# Patient Record
Sex: Male | Born: 1960 | Race: White | Hispanic: No | Marital: Married | Smoking: Former smoker
Health system: Southern US, Community
[De-identification: ages and names within clinical notes are randomized; demographics above are authoritative.]

## PROBLEM LIST (undated history)

## (undated) DIAGNOSIS — F1490 Cocaine use, unspecified, uncomplicated: Secondary | ICD-10-CM

## (undated) DIAGNOSIS — S43006A Unspecified dislocation of unspecified shoulder joint, initial encounter: Secondary | ICD-10-CM

## (undated) DIAGNOSIS — K219 Gastro-esophageal reflux disease without esophagitis: Secondary | ICD-10-CM

## (undated) DIAGNOSIS — T8859XA Other complications of anesthesia, initial encounter: Secondary | ICD-10-CM

## (undated) DIAGNOSIS — R112 Nausea with vomiting, unspecified: Secondary | ICD-10-CM

## (undated) DIAGNOSIS — B059 Measles without complication: Secondary | ICD-10-CM

## (undated) DIAGNOSIS — Z87442 Personal history of urinary calculi: Secondary | ICD-10-CM

## (undated) DIAGNOSIS — H269 Unspecified cataract: Secondary | ICD-10-CM

## (undated) DIAGNOSIS — Z9889 Other specified postprocedural states: Secondary | ICD-10-CM

## (undated) DIAGNOSIS — S32009A Unspecified fracture of unspecified lumbar vertebra, initial encounter for closed fracture: Secondary | ICD-10-CM

## (undated) DIAGNOSIS — F1910 Other psychoactive substance abuse, uncomplicated: Secondary | ICD-10-CM

## (undated) DIAGNOSIS — B019 Varicella without complication: Secondary | ICD-10-CM

## (undated) DIAGNOSIS — T4145XA Adverse effect of unspecified anesthetic, initial encounter: Secondary | ICD-10-CM

## (undated) HISTORY — DX: Unspecified fracture of unspecified lumbar vertebra, initial encounter for closed fracture: S32.009A

## (undated) HISTORY — DX: Unspecified dislocation of unspecified shoulder joint, initial encounter: S43.006A

## (undated) HISTORY — DX: Measles without complication: B05.9

## (undated) HISTORY — DX: Varicella without complication: B01.9

## (undated) HISTORY — DX: Cocaine use, unspecified, uncomplicated: F14.90

## (undated) HISTORY — PX: FRACTURE SURGERY: SHX138

## (undated) HISTORY — DX: Gastro-esophageal reflux disease without esophagitis: K21.9

## (undated) HISTORY — DX: Unspecified cataract: H26.9

## (undated) HISTORY — DX: Other psychoactive substance abuse, uncomplicated: F19.10

## (undated) HISTORY — PX: HERNIA REPAIR: SHX51

---

## 1982-10-19 DIAGNOSIS — S32009A Unspecified fracture of unspecified lumbar vertebra, initial encounter for closed fracture: Secondary | ICD-10-CM

## 1982-10-19 HISTORY — DX: Unspecified fracture of unspecified lumbar vertebra, initial encounter for closed fracture: S32.009A

## 1990-10-19 HISTORY — PX: ORIF ANKLE FRACTURE: SUR919

## 2003-11-09 ENCOUNTER — Encounter: Payer: Self-pay | Admitting: Family Medicine

## 2004-12-01 ENCOUNTER — Ambulatory Visit: Payer: Self-pay | Admitting: Family Medicine

## 2004-12-01 LAB — CONVERTED CEMR LAB
Blood Glucose, Fasting: 90 mg/dL
TSH: 1.09 microintl units/mL

## 2004-12-03 ENCOUNTER — Ambulatory Visit: Payer: Self-pay | Admitting: Family Medicine

## 2005-11-10 ENCOUNTER — Ambulatory Visit: Payer: Self-pay | Admitting: Unknown Physician Specialty

## 2005-11-19 ENCOUNTER — Ambulatory Visit: Payer: Self-pay | Admitting: Unknown Physician Specialty

## 2007-11-11 ENCOUNTER — Encounter: Payer: Self-pay | Admitting: Family Medicine

## 2007-11-11 DIAGNOSIS — E7800 Pure hypercholesterolemia, unspecified: Secondary | ICD-10-CM

## 2007-11-22 ENCOUNTER — Ambulatory Visit: Payer: Self-pay | Admitting: Family Medicine

## 2007-11-22 LAB — CONVERTED CEMR LAB
Bilirubin, Direct: 0.1 mg/dL (ref 0.0–0.3)
CO2: 26 meq/L (ref 19–32)
Creatinine, Ser: 1 mg/dL (ref 0.4–1.5)
HDL: 32.4 mg/dL — ABNORMAL LOW (ref >39.0)
Potassium: 4.2 meq/L (ref 3.5–5.1)
Sodium: 140 meq/L (ref 135–145)
TSH: 1.71 microintl units/mL (ref 0.35–5.50)
Total Bilirubin: 0.9 mg/dL (ref 0.3–1.2)
Total Protein: 6.3 g/dL (ref 6.0–8.3)
Triglycerides: 97 mg/dL (ref 0–149)

## 2007-11-24 ENCOUNTER — Ambulatory Visit: Payer: Self-pay | Admitting: Family Medicine

## 2008-05-11 ENCOUNTER — Emergency Department: Payer: Self-pay | Admitting: Emergency Medicine

## 2010-05-22 ENCOUNTER — Encounter (INDEPENDENT_AMBULATORY_CARE_PROVIDER_SITE_OTHER): Payer: Self-pay | Admitting: *Deleted

## 2010-11-18 NOTE — Letter (Signed)
Summary: Nadara Eaton letter  [place] at [facility]  [address] [place], [place] [address]   Phone: [phone]  Fax: [phone]       05/22/2010 MRN: [id number]  JESTIN BURBACH [address] [place], [place]  [address]  Dear Mr. Jennye [facility] [place], and [place] announce the retirement of Arta Silence, M.D., from full-time practice at the [facility] office effective April 17, 2010 and his plans of returning part-time.  It is important to Dr. Hetty Ely and to our practice that you understand that [facility] has seven physicians in our office for your health care needs.  We will continue to offer the same exceptional care that you have today.    Dr. Hetty Ely has spoken to many of you about his plans for retirement and returning part-time in the fall.   We will continue to work with you through the transition to schedule appointments for you in the office and meet the high standards that [place] is committed to.   Again, it is with great pleasure that we share the news that Dr. Hetty Ely will return to [facility] at [facility] in October of 2011 with a reduced schedule.    If you have any questions, or would like to request an appointment with one of our physicians, please call us at [phone] and press the option for Scheduling an appointment.  We take pleasure in providing you with excellent patient care and look forward to seeing you at your next office visit.  Our [facility] Physicians are:  Tillman Abide, M.D. Laurita Quint, M.D. Roxy Manns, M.D. Kerby Nora, M.D. Hannah Beat, M.D. Ruthe Mannan, M.D. We proudly welcomed Raechel Ache, M.D. and Eustaquio Boyden, M.D. to the practice in July/August 2011.  Sincerely,  [facility]

## 2011-04-18 ENCOUNTER — Encounter: Payer: Self-pay | Admitting: Family Medicine

## 2011-04-30 ENCOUNTER — Ambulatory Visit: Payer: Self-pay | Admitting: Family Medicine

## 2011-12-11 ENCOUNTER — Ambulatory Visit: Payer: Self-pay | Admitting: Internal Medicine

## 2012-01-08 ENCOUNTER — Ambulatory Visit (INDEPENDENT_AMBULATORY_CARE_PROVIDER_SITE_OTHER): Payer: BC Managed Care – PPO | Admitting: Internal Medicine

## 2012-01-08 ENCOUNTER — Encounter: Payer: Self-pay | Admitting: Internal Medicine

## 2012-01-08 DIAGNOSIS — Z0000 Encounter for general adult medical examination without abnormal findings: Secondary | ICD-10-CM | POA: Insufficient documentation

## 2012-01-08 DIAGNOSIS — F1911 Other psychoactive substance abuse, in remission: Secondary | ICD-10-CM | POA: Insufficient documentation

## 2012-01-08 DIAGNOSIS — E7800 Pure hypercholesterolemia, unspecified: Secondary | ICD-10-CM

## 2012-01-08 DIAGNOSIS — Z23 Encounter for immunization: Secondary | ICD-10-CM

## 2012-01-08 LAB — LIPID PANEL
LDL Cholesterol: 90 mg/dL (ref 0–99)
Total CHOL/HDL Ratio: 4.5 Ratio

## 2012-01-08 LAB — COMPREHENSIVE METABOLIC PANEL
ALT: 17 U/L (ref 0–53)
AST: 18 U/L (ref 0–37)
Calcium: 8.6 mg/dL (ref 8.4–10.5)
Chloride: 108 mEq/L (ref 96–112)
Creat: 1.12 mg/dL (ref 0.50–1.35)
Potassium: 4.1 mEq/L (ref 3.5–5.3)

## 2012-01-08 LAB — CBC WITH DIFFERENTIAL/PLATELET
Basophils Absolute: 0 10*3/uL (ref 0.0–0.1)
Eosinophils Relative: 1 % (ref 0–5)
Lymphocytes Relative: 32 % (ref 12–46)
Neutro Abs: 3.6 10*3/uL (ref 1.7–7.7)
Platelets: 226 10*3/uL (ref 150–400)
RDW: 12.1 % (ref 11.5–15.5)
WBC: 6 10*3/uL (ref 4.0–10.5)

## 2012-01-08 NOTE — Assessment & Plan Note (Signed)
Patient history of cocaine abuse. Currently drug-free. Encouraged him to continue to follow with his sponsor. Encourage continued abstinence. Followup as needed.

## 2012-01-08 NOTE — Progress Notes (Signed)
  Subjective:    Patient ID: Alexander Walter, male    DOB: [birth year], 51 y.o.   MRN: [id number]  HPI 51 year old male presents to establish care and for general exam. He reports that he is generally doing well. 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 He reports that he has generally been healthy in his life. He has suffered from addiction to cocaine. He notes that he has been drug free for the last 8 months. At one point, he was drug free for 10 years. He notes this has been a difficult struggle for him. He is currently separated from his wife but living with her. He denies any complaints today. He reports he is feeling well. He has a normal appetite and energy level.  No outpatient encounter prescriptions on file as of 01/08/2012.    Review of Systems  Constitutional: Negative for fever, chills, activity change, appetite change, fatigue and unexpected weight change.  Eyes: Negative for visual disturbance.  Respiratory: Negative for cough and shortness of breath.   Cardiovascular: Negative for chest pain, palpitations and leg swelling.  Gastrointestinal: Negative for abdominal pain and abdominal distention.  Genitourinary: Negative for dysuria, urgency and difficulty urinating.  Musculoskeletal: Negative for arthralgias and gait problem.  Skin: Negative for color change and rash.  Hematological: Negative for adenopathy.  Psychiatric/Behavioral: Negative for sleep disturbance and dysphoric mood. The patient is not nervous/anxious.    BP 108/62  Pulse 81  Temp(Src) 98 F (36.7 C) (Oral)  Ht 5\' 7"  (1.702 m)  Wt 180 lb (81.647 kg)  BMI 28.19 kg/m2  SpO2 97%     Objective:   Physical Exam  Constitutional: He is oriented to person, place, and time. He appears well-developed and well-nourished. No distress.  HENT:  Head: Normocephalic and atraumatic.  Right Ear: External ear normal.  Left Ear: External ear normal.  Nose: Nose normal.    Mouth/Throat: Oropharynx is clear and moist. No oropharyngeal exudate.  Eyes: Conjunctivae and EOM are normal. Pupils are equal, round, and reactive to light. Right eye exhibits no discharge. Left eye exhibits no discharge. No scleral icterus.  Neck: Normal range of motion. Neck supple. No tracheal deviation present. No thyromegaly present.  Cardiovascular: Normal rate, regular rhythm and normal heart sounds.  Exam reveals no gallop and no friction rub.   No murmur heard. Pulmonary/Chest: Effort normal and breath sounds normal. No respiratory distress. He has no wheezes. He has no rales. He exhibits no tenderness.  Abdominal: Soft. Bowel sounds are normal. He exhibits no distension and no mass. There is no tenderness. There is no rebound and no guarding.  Musculoskeletal: Normal range of motion. He exhibits no edema.  Lymphadenopathy:    He has no cervical adenopathy.  Neurological: He is alert and oriented to person, place, and time. No cranial nerve deficit. Coordination normal.  Skin: Skin is warm and dry. No rash noted. He is not diaphoretic. No erythema. No pallor.  Psychiatric: He has a normal mood and affect. His behavior is normal. Judgment and thought content normal.          Assessment & Plan:

## 2012-01-08 NOTE — Assessment & Plan Note (Signed)
Exam normal today. Will check CBC, CMP, and lipids.  Will give TDAP.  Discussed screening colonoscopy and pt will look into insurance coverage for this.  Follow up 1 year and prn.

## 2012-10-19 HISTORY — PX: COLONOSCOPY: SHX174

## 2012-12-03 ENCOUNTER — Other Ambulatory Visit: Payer: Self-pay

## 2013-02-24 ENCOUNTER — Encounter: Payer: Self-pay | Admitting: Internal Medicine

## 2013-02-24 ENCOUNTER — Ambulatory Visit (INDEPENDENT_AMBULATORY_CARE_PROVIDER_SITE_OTHER): Payer: BC Managed Care – PPO | Admitting: Internal Medicine

## 2013-02-24 VITALS — BP 98/68 | HR 80 | Temp 97.9°F | Ht 67.0 in | Wt 179.0 lb

## 2013-02-24 DIAGNOSIS — Z0000 Encounter for general adult medical examination without abnormal findings: Secondary | ICD-10-CM

## 2013-02-24 LAB — COMPREHENSIVE METABOLIC PANEL
ALT: 18 U/L (ref 0–53)
AST: 18 U/L (ref 0–37)
Calcium: 9.1 mg/dL (ref 8.4–10.5)
Chloride: 105 mEq/L (ref 96–112)
Creatinine, Ser: 1.2 mg/dL (ref 0.4–1.5)
Potassium: 4.2 mEq/L (ref 3.5–5.1)
Sodium: 138 mEq/L (ref 135–145)
Total Protein: 6.5 g/dL (ref 6.0–8.3)

## 2013-02-24 LAB — CBC WITH DIFFERENTIAL/PLATELET
Eosinophils Absolute: 0.1 10*3/uL (ref 0.0–0.7)
MCHC: 34.8 g/dL (ref 30.0–36.0)
MCV: 94.7 fl (ref 78.0–100.0)
Monocytes Absolute: 0.5 10*3/uL (ref 0.1–1.0)
Neutrophils Relative %: 66 % (ref 43.0–77.0)
Platelets: 213 10*3/uL (ref 150.0–400.0)
WBC: 5.7 10*3/uL (ref 4.5–10.5)

## 2013-02-24 LAB — LIPID PANEL
HDL: 35.9 mg/dL — ABNORMAL LOW (ref >39.00)
LDL Cholesterol: 98 mg/dL (ref 0–99)
VLDL: 29 mg/dL (ref 0.0–40.0)

## 2013-02-24 NOTE — Progress Notes (Signed)
  Subjective:    Patient ID: Alexander Walter, male    DOB: Dec 04, 1960, 52 y.o.   MRN: [id number]  HPI 52YO male presents for annual exam. Doing well. No concerns today. Trying to follow healthy diet. 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 Also has second job at [employer].   No outpatient encounter prescriptions on file as of 02/24/2013.   No facility-administered encounter medications on file as of 02/24/2013.   BP 98/68  Pulse 80  Temp(Src) 97.9 F (36.6 C) (Oral)  Ht 5\' 7"  (1.702 m)  Wt 179 lb (81.194 kg)  BMI 28.03 kg/m2  SpO2 96%  Review of Systems  Constitutional: Negative for fever, chills, activity change, appetite change, fatigue and unexpected weight change.  Eyes: Negative for visual disturbance.  Respiratory: Negative for cough and shortness of breath.   Cardiovascular: Negative for chest pain, palpitations and leg swelling.  Gastrointestinal: Negative for abdominal pain and abdominal distention.  Genitourinary: Negative for dysuria, urgency and difficulty urinating.  Musculoskeletal: Negative for arthralgias and gait problem.  Skin: Negative for color change and rash.  Hematological: Negative for adenopathy.  Psychiatric/Behavioral: Negative for sleep disturbance and dysphoric mood. The patient is not nervous/anxious.        Objective:   Physical Exam  Constitutional: He is oriented to person, place, and time. He appears well-developed and well-nourished. No distress.  HENT:  Head: Normocephalic and atraumatic.  Right Ear: External ear normal.  Left Ear: External ear normal.  Nose: Nose normal.  Mouth/Throat: Oropharynx is clear and moist. No oropharyngeal exudate.  Eyes: Conjunctivae and EOM are normal. Pupils are equal, round, and reactive to light. Right eye exhibits no discharge. Left eye exhibits no discharge. No scleral icterus.  Neck: Normal range of motion. Neck supple. No tracheal deviation present. No thyromegaly present.  Cardiovascular: Normal rate,  regular rhythm and normal heart sounds.  Exam reveals no gallop and no friction rub.   No murmur heard. Pulmonary/Chest: Effort normal and breath sounds normal. No respiratory distress. He has no wheezes. He has no rales. He exhibits no tenderness.  Abdominal: Soft. Bowel sounds are normal. He exhibits no distension and no mass. There is no tenderness. There is no rebound and no guarding.  Musculoskeletal: Normal range of motion. He exhibits no edema.  Lymphadenopathy:    He has no cervical adenopathy.  Neurological: He is alert and oriented to person, place, and time. No cranial nerve deficit. Coordination normal.  Skin: Skin is warm and dry. No rash noted. He is not diaphoretic. No erythema. No pallor.  Psychiatric: He has a normal mood and affect. His behavior is normal. Judgment and thought content normal.          Assessment & Plan:

## 2013-02-24 NOTE — Assessment & Plan Note (Signed)
General medical exam normal today. Health maintenance is up to date except for colonoscopy. Referral placed today. Reviewed risks and benefits of PSA testing. Will check PSA today with labs. Will also check CMP, Lipids, CBC with labs. Encouraged continued healthy diet and regular physical activity. Follow up 1 year and prn.

## 2013-02-25 LAB — PSA, TOTAL AND FREE: PSA, Free Pct: 22 % — ABNORMAL LOW (ref >25)

## 2013-05-18 ENCOUNTER — Encounter: Payer: Self-pay | Admitting: Unknown Physician Specialty

## 2013-08-24 ENCOUNTER — Other Ambulatory Visit: Payer: Self-pay

## 2014-03-02 ENCOUNTER — Encounter: Payer: BC Managed Care – PPO | Admitting: Internal Medicine

## 2015-09-11 ENCOUNTER — Encounter: Payer: Self-pay | Admitting: Family Medicine

## 2015-09-11 ENCOUNTER — Ambulatory Visit (INDEPENDENT_AMBULATORY_CARE_PROVIDER_SITE_OTHER): Payer: BLUE CROSS/BLUE SHIELD | Admitting: Family Medicine

## 2015-09-11 VITALS — BP 125/86 | HR 86 | Temp 97.7°F | Resp 16 | Ht 67.0 in | Wt 177.6 lb

## 2015-09-11 DIAGNOSIS — R0602 Shortness of breath: Secondary | ICD-10-CM

## 2015-09-11 DIAGNOSIS — K219 Gastro-esophageal reflux disease without esophagitis: Secondary | ICD-10-CM | POA: Diagnosis not present

## 2015-09-11 DIAGNOSIS — F17200 Nicotine dependence, unspecified, uncomplicated: Secondary | ICD-10-CM | POA: Insufficient documentation

## 2015-09-11 DIAGNOSIS — Z720 Tobacco use: Secondary | ICD-10-CM

## 2015-09-11 DIAGNOSIS — R0789 Other chest pain: Secondary | ICD-10-CM

## 2015-09-11 DIAGNOSIS — Z8249 Family history of ischemic heart disease and other diseases of the circulatory system: Secondary | ICD-10-CM | POA: Diagnosis not present

## 2015-09-11 HISTORY — DX: Shortness of breath: R06.02

## 2015-09-11 MED ORDER — OMEPRAZOLE 20 MG PO CPDR: 20.0000 mg | DELAYED_RELEASE_CAPSULE | Freq: Every day | ORAL | Status: DC

## 2015-09-11 MED ORDER — ALBUTEROL SULFATE HFA 108 (90 BASE) MCG/ACT IN AERS: 2.0000 | INHALATION_SPRAY | Freq: Four times a day (QID) | RESPIRATORY_TRACT | Status: DC | PRN

## 2015-09-11 NOTE — Patient Instructions (Signed)
I think your symptoms are coming from your lungs but I do want to rule out a cardiac cause given your family history. We will schedule an appt at [facility] to rule out cardiac causes.   Take your acid reflux medication every day 1 hour prior to a meal. This may help improve your symptoms.   Please seek immediate medical attention at ER or Urgent Care if you develop: Chest pain, pressure or tightness. Shortness of breath accompanied by nausea or diaphoresis Visual changes Numbness or tingling on one side of the body Facial droop Altered mental status Or any concerning symptoms.

## 2015-09-11 NOTE — Progress Notes (Signed)
Subjective:    Patient ID: Alexander Walter, male    DOB: [birth year], 54 y.o.   MRN: [id number]  HPI: Alexander Walter is a 54 y.o. male presenting on 09/11/2015 for Establish Care   HPI  Pt presents to establish care today. Previous care provider was [employer] [facility].  It has been several years since last PCP visit. Records from previous provider will be requested and reviewed. Current medical problems include:  Acid Reflux: Occasional symptoms. Has been occuring more as he ages. Occasional tums over the counter. Triggers: chicken.  Feels as though food occasionally gets stuck.  SOB/chest tightness: Pt reporting SOB started 1 month ago. SOB occurred at rest. Described as needed to take deep breath. Chest tightness feels as though if he stretched he could get rid of it. No palpitations. Strong family history of heart disease- onset in the 67's. No pattern to symptoms. Not impacted by exertion.   Health maintenance:  Colonoscopy: 2014 normal. PSA: unsure if he would like to screen.  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     Past Medical History  Diagnosis Date  . Crack cocaine use     Last use approx 8 mths ago(12/2011)  . Lumbar vertebral fracture ([place]) 1984  . Dislocated shoulder   . Kidney stone 2009  . Chicken pox     childhood.  . Measles     childhood.   Marland Kitchen GERD (gastroesophageal reflux disease)    Social History   Social History  . Marital Status: Married    Spouse Name: N/A  . Number of Children: 2  . Years of Education: N/A   Occupational History  . [employer]ie Better Surveyor    Social History Main Topics  . Smoking status: Current Some Day Smoker -- 0.50 packs/day for 1 years    Types: Cigarettes    Last Attempt to Quit: 01/08/2011  . Smokeless tobacco: Current User  . Alcohol Use: No     Comment: Last drank 3 years ago  . Drug Use: No  . Sexual Activity: Not on file   Other Topics Concern  . Not on file   Social History  Narrative   Lives in [place], separated and lives with wife. 15YO daughter and 30YO son. 3 dogs and fish. Works as [employer]. Goes to [employer].   Family History  Problem Relation Age of Onset  . Hyperlipidemia Mother   . Heart disease Mother     murmur  . Depression Father   . Diabetes Father   . Heart disease Maternal Uncle     MI's multiple  . Cancer Maternal Grandfather     ? MGF  . Heart disease Maternal Uncle 50   No current outpatient prescriptions on file prior to visit.   No current facility-administered medications on file prior to visit.    Review of Systems  Constitutional: Negative for fever and chills.  HENT: Negative.   Respiratory: Positive for chest tightness and shortness of breath. Negative for wheezing.   Cardiovascular: Positive for chest pain (described as tightness or pressure.). Negative for palpitations and leg swelling.  Gastrointestinal: Negative for nausea, vomiting and abdominal pain.  Endocrine: Negative.   Genitourinary: Negative for dysuria, urgency, discharge, penile pain and testicular pain.  Musculoskeletal: Negative for back pain, joint swelling and arthralgias.  Skin: Negative.   Neurological: Negative for dizziness, weakness, numbness and headaches.  Psychiatric/Behavioral: Negative for sleep disturbance and dysphoric mood.   Per HPI unless specifically  indicated above     Objective:    BP 125/86 mmHg  Pulse 86  Temp(Src) 97.7 F (36.5 C) (Oral)  Resp 16  Ht 5' 7"  (1.702 m)  Wt 177 lb 9.6 oz (80.559 kg)  BMI 27.81 kg/m2  Wt Readings from Last 3 Encounters:  09/11/15 177 lb 9.6 oz (80.559 kg)  02/24/13 179 lb (81.194 kg)  01/08/12 180 lb (81.647 kg)    Physical Exam  Constitutional: He is oriented to person, place, and time. He appears well-developed and well-nourished. No distress.  HENT:  Head: Normocephalic and atraumatic.  Neck: Neck supple. No thyromegaly present.  Cardiovascular: Normal rate, regular  rhythm and normal heart sounds.  Exam reveals no gallop and no friction rub.   No murmur heard. Pulmonary/Chest: Effort normal and breath sounds normal. He has no wheezes.  Abdominal: Soft. Bowel sounds are normal. He exhibits no distension. There is no tenderness. There is no rebound.  Musculoskeletal: Normal range of motion. He exhibits no edema or tenderness.  Neurological: He is alert and oriented to person, place, and time. He has normal reflexes.  Skin: Skin is warm and dry. No rash noted. No erythema.  Psychiatric: He has a normal mood and affect. His behavior is normal. Thought content normal.   Results for orders placed or performed in visit on 02/24/13  Comp Met (CMET)  Result Value Ref Range   Sodium 138 135 - 145 mEq/L   Potassium 4.2 3.5 - 5.1 mEq/L   Chloride 105 96 - 112 mEq/L   CO2 26 19 - 32 mEq/L   Glucose, Bld 83 70 - 99 mg/dL   BUN 15 6 - 23 mg/dL   Creatinine, Ser 1.2 0.4 - 1.5 mg/dL   Total Bilirubin 0.8 0.3 - 1.2 mg/dL   Alkaline Phosphatase 59 39 - 117 U/L   AST 18 0 - 37 U/L   ALT 18 0 - 53 U/L   Total Protein 6.5 6.0 - 8.3 g/dL   Albumin 3.8 3.5 - 5.2 g/dL   Calcium 9.1 8.4 - 10.5 mg/dL   GFR 68.32 >60.00 mL/min  PSA, total and free  Result Value Ref Range   PSA 1.44 <=4.00 ng/mL   PSA, Free 0.31 ng/mL   PSA, Free Pct 22 (L) >25 %  Lipid Profile  Result Value Ref Range   Cholesterol 163 0 - 200 mg/dL   Triglycerides 145.0 0.0 - 149.0 mg/dL   HDL 35.90 (L) >39.00 mg/dL   VLDL 29.0 0.0 - 40.0 mg/dL   LDL Cholesterol 98 0 - 99 mg/dL   Total CHOL/HDL Ratio 5   CBC w/Diff  Result Value Ref Range   WBC 5.7 4.5 - 10.5 K/uL   RBC 4.48 4.22 - 5.81 Mil/uL   Hemoglobin 14.8 13.0 - 17.0 g/dL   HCT 42.4 39.0 - 52.0 %   MCV 94.7 78.0 - 100.0 fl   MCHC 34.8 30.0 - 36.0 g/dL   RDW 12.8 11.5 - 14.6 %   Platelets 213.0 150.0 - 400.0 K/uL   Neutrophils Relative % 66.0 43.0 - 77.0 %   Lymphocytes Relative 23.9 12.0 - 46.0 %   Monocytes Relative 8.5 3.0 - 12.0  %   Eosinophils Relative 1.0 0.0 - 5.0 %   Basophils Relative 0.6 0.0 - 3.0 %   Neutro Abs 3.8 1.4 - 7.7 K/uL   Lymphs Abs 1.4 0.7 - 4.0 K/uL   Monocytes Absolute 0.5 0.1 - 1.0 K/uL   Eosinophils Absolute 0.1 0.0 -  0.7 K/uL   Basophils Absolute 0.0 0.0 - 0.1 K/uL      Assessment & Plan:   Problem List Items Addressed This Visit      Digestive   GERD (gastroesophageal reflux disease)   Relevant Medications   omeprazole (PRILOSEC) 20 MG capsule   Other Relevant Orders   CBC with Differential     Other   Smoker   Shortness of breath - Primary    GERD inducted bronchospasm vs. Cardiovascular disease. R/o ASCVD due to strong family history of heart disease.   Trial of albulterol inhaler. Spirometry next visit.       Relevant Orders   EKG 12-Lead   Comprehensive Metabolic Panel (CMET)   Chest tightness    GERD induced asthmas vs cardiovascular origin. ECG WNL. Given strong family history- would like Cardiology consult to stratify risk factors. Proceed with respiratory work-up after cardiac origin excluded.       Relevant Orders   EKG 12-Lead   Lipid Profile    Other Visit Diagnoses    Family history of heart disease in male family member before age 31        Relevant Orders    Lipid Profile       Meds ordered this encounter  Medications  . omeprazole (PRILOSEC) 20 MG capsule    Sig: Take 1 capsule (20 mg total) by mouth daily.    Dispense:  30 capsule    Refill:  3    Order Specific Question:  Supervising Provider    Answer:  Arlis Porta [[id number]]      Follow up plan: No Follow-up on file.

## 2015-09-11 NOTE — Assessment & Plan Note (Signed)
GERD inducted bronchospasm vs. Cardiovascular disease. R/o ASCVD due to strong family history of heart disease.   Trial of albulterol inhaler. Spirometry next visit.

## 2015-09-11 NOTE — Assessment & Plan Note (Signed)
5 minutes of smoking cessation counseling provided. Pt encouraged to set a quit date. Referred to [place] Quitline.

## 2015-09-11 NOTE — Assessment & Plan Note (Signed)
GERD induced asthmas vs cardiovascular origin. ECG WNL. Given strong family history- would like Cardiology consult to stratify risk factors. Proceed with respiratory work-up after cardiac origin excluded.

## 2015-09-12 LAB — CBC WITH DIFFERENTIAL/PLATELET
BASOS ABS: 0 10*3/uL (ref 0.0–0.2)
BASOS: 1 %
EOS (ABSOLUTE): 0.1 10*3/uL (ref 0.0–0.4)
Eos: 1 %
HEMOGLOBIN: 15.2 g/dL (ref 12.6–17.7)
Hematocrit: 44.4 % (ref 37.5–51.0)
Immature Grans (Abs): 0 10*3/uL (ref 0.0–0.1)
Immature Granulocytes: 1 %
LYMPHS: 29 %
Lymphocytes Absolute: 1.7 10*3/uL (ref 0.7–3.1)
MCH: 32.3 pg (ref 26.6–33.0)
MCHC: 34.2 g/dL (ref 31.5–35.7)
MCV: 94 fL (ref 79–97)
Monocytes Absolute: 0.5 10*3/uL (ref 0.1–0.9)
Monocytes: 8 %
NEUTROS PCT: 60 %
Neutrophils Absolute: 3.5 10*3/uL (ref 1.4–7.0)
Platelets: 276 10*3/uL (ref 150–379)
RBC: 4.71 x10E6/uL (ref 4.14–5.80)
RDW: 12.3 % (ref 12.3–15.4)
WBC: 5.8 10*3/uL (ref 3.4–10.8)

## 2015-09-12 LAB — COMPREHENSIVE METABOLIC PANEL
ALK PHOS: 65 IU/L (ref 39–117)
ALT: 17 IU/L (ref 0–44)
AST: 15 IU/L (ref 0–40)
Albumin/Globulin Ratio: 1.8 (ref 1.1–2.5)
Albumin: 4.3 g/dL (ref 3.5–5.5)
BUN/Creatinine Ratio: 12 (ref 9–20)
BUN: 12 mg/dL (ref 6–24)
Bilirubin Total: 0.5 mg/dL (ref 0.0–1.2)
CO2: 23 mmol/L (ref 18–29)
CREATININE: 0.99 mg/dL (ref 0.76–1.27)
Calcium: 9.4 mg/dL (ref 8.7–10.2)
Chloride: 100 mmol/L (ref 97–106)
GFR calc Af Amer: 98 mL/min/{1.73_m2} (ref >59)
GFR calc non Af Amer: 85 mL/min/{1.73_m2} (ref >59)
GLUCOSE: 78 mg/dL (ref 65–99)
Globulin, Total: 2.4 g/dL (ref 1.5–4.5)
Potassium: 4.6 mmol/L (ref 3.5–5.2)
SODIUM: 139 mmol/L (ref 136–144)
Total Protein: 6.7 g/dL (ref 6.0–8.5)

## 2015-09-12 LAB — LIPID PANEL
CHOLESTEROL TOTAL: 183 mg/dL (ref 100–199)
Chol/HDL Ratio: 4.5 ratio units (ref 0.0–5.0)
HDL: 41 mg/dL (ref >39)
LDL CALC: 110 mg/dL — AB (ref 0–99)
TRIGLYCERIDES: 161 mg/dL — AB (ref 0–149)
VLDL Cholesterol Cal: 32 mg/dL (ref 5–40)

## 2015-11-20 ENCOUNTER — Ambulatory Visit: Payer: BC Managed Care – PPO | Admitting: Cardiovascular Disease

## 2016-01-28 ENCOUNTER — Encounter: Payer: Self-pay | Admitting: Family Medicine

## 2016-01-28 ENCOUNTER — Ambulatory Visit (INDEPENDENT_AMBULATORY_CARE_PROVIDER_SITE_OTHER): Payer: BLUE CROSS/BLUE SHIELD | Admitting: Family Medicine

## 2016-01-28 VITALS — BP 127/86 | HR 68 | Temp 98.3°F | Resp 16 | Ht 67.0 in | Wt 181.0 lb

## 2016-01-28 DIAGNOSIS — R1900 Intra-abdominal and pelvic swelling, mass and lump, unspecified site: Secondary | ICD-10-CM | POA: Diagnosis not present

## 2016-01-28 DIAGNOSIS — R1909 Other intra-abdominal and pelvic swelling, mass and lump: Secondary | ICD-10-CM

## 2016-01-28 NOTE — Progress Notes (Signed)
Subjective:    Patient ID: Alexander Walter, male    DOB: 01-28-1961, 55 y.o.   MRN: [id number]  HPI: Jazarion Mcmannis is a 55 y.o. male presenting on 01/28/2016 for Groin Swelling   HPI  Pt presents for groin swelling on R side. Noticed a bulge around 1 week ago. Not painful. Has not noticed a lump or bulge present before. Bulge is staying the same size. Does not do heavy lifting. No recent moving or straining. Pt is overweight. No color change in the skin. Soft bulge. No previous history of hernia.   Past Medical History  Diagnosis Date  . Crack cocaine use     Last use approx 8 mths ago(12/2011)  . Lumbar vertebral fracture ([place]) 1984  . Dislocated shoulder   . Kidney stone 2009  . Chicken pox     childhood.  . Measles     childhood.   Marland Kitchen GERD (gastroesophageal reflux disease)     No current outpatient prescriptions on file prior to visit.   No current facility-administered medications on file prior to visit.    Review of Systems Per HPI unless specifically indicated above     Objective:    BP 127/86 mmHg  Pulse 68  Temp(Src) 98.3 F (36.8 C) (Oral)  Resp 16  Ht 5\' 7"  (1.702 m)  Wt 181 lb (82.101 kg)  BMI 28.34 kg/m2  Wt Readings from Last 3 Encounters:  01/28/16 181 lb (82.101 kg)  09/11/15 177 lb 9.6 oz (80.559 kg)  02/24/13 179 lb (81.194 kg)    Physical Exam  Constitutional: He is oriented to person, place, and time. He appears well-developed and well-nourished. No distress.  Cardiovascular: Normal rate and regular rhythm.  Exam reveals no gallop and no friction rub.   No murmur heard. Pulmonary/Chest: Effort normal and breath sounds normal.  Abdominal: Soft. Bowel sounds are normal. There is no hepatosplenomegaly, splenomegaly or hepatomegaly. There is no tenderness. Hernia confirmed negative in the right inguinal area and confirmed negative in the left inguinal area.    Genitourinary: Penis normal. Circumcised.  Neurological: He is alert and  oriented to person, place, and time.  Skin: He is not diaphoretic.   Results for orders placed or performed in visit on 09/11/15  Lipid Profile  Result Value Ref Range   Cholesterol, Total 183 100 - 199 mg/dL   Triglycerides 161 (H) 0 - 149 mg/dL   HDL 41 >39 mg/dL   VLDL Cholesterol Cal 32 5 - 40 mg/dL   LDL Calculated 110 (H) 0 - 99 mg/dL   Chol/HDL Ratio 4.5 0.0 - 5.0 ratio units  Comprehensive Metabolic Panel (CMET)  Result Value Ref Range   Glucose 78 65 - 99 mg/dL   BUN 12 6 - 24 mg/dL   Creatinine, Ser 0.99 0.76 - 1.27 mg/dL   GFR calc non Af Amer 85 >59 mL/min/1.73   GFR calc Af Amer 98 >59 mL/min/1.73   BUN/Creatinine Ratio 12 9 - 20   Sodium 139 136 - 144 mmol/L   Potassium 4.6 3.5 - 5.2 mmol/L   Chloride 100 97 - 106 mmol/L   CO2 23 18 - 29 mmol/L   Calcium 9.4 8.7 - 10.2 mg/dL   Total Protein 6.7 6.0 - 8.5 g/dL   Albumin 4.3 3.5 - 5.5 g/dL   Globulin, Total 2.4 1.5 - 4.5 g/dL   Albumin/Globulin Ratio 1.8 1.1 - 2.5   Bilirubin Total 0.5 0.0 - 1.2 mg/dL   Alkaline Phosphatase  65 39 - 117 IU/L   AST 15 0 - 40 IU/L   ALT 17 0 - 44 IU/L  CBC with Differential  Result Value Ref Range   WBC 5.8 3.4 - 10.8 x10E3/uL   RBC 4.71 4.14 - 5.80 x10E6/uL   Hemoglobin 15.2 12.6 - 17.7 g/dL   Hematocrit 44.4 37.5 - 51.0 %   MCV 94 79 - 97 fL   MCH 32.3 26.6 - 33.0 pg   MCHC 34.2 31.5 - 35.7 g/dL   RDW 12.3 12.3 - 15.4 %   Platelets 276 150 - 379 x10E3/uL   Neutrophils 60 %   Lymphs 29 %   Monocytes 8 %   Eos 1 %   Basos 1 %   Neutrophils Absolute 3.5 1.4 - 7.0 x10E3/uL   Lymphocytes Absolute 1.7 0.7 - 3.1 x10E3/uL   Monocytes Absolute 0.5 0.1 - 0.9 x10E3/uL   EOS (ABSOLUTE) 0.1 0.0 - 0.4 x10E3/uL   Basophils Absolute 0.0 0.0 - 0.2 x10E3/uL   Immature Granulocytes 1 %   Immature Grans (Abs) 0.0 0.0 - 0.1 x10E3/uL      Assessment & Plan:   Problem List Items Addressed This Visit    None    Visit Diagnoses    Groin swelling    -  Primary    [place] to r/o hernia.  Consider surgical referral if positive for hernia. Alarm symptoms reviewed with patient. Follow-up pending [place].     Relevant Orders    [place] Extrem Low Right Ltd       No orders of the defined types were placed in this encounter.      Follow up plan: Return if symptoms worsen or fail to improve, for pending [place]. Marland Kitchen

## 2016-01-28 NOTE — Patient Instructions (Signed)
We will rule out a hernia with an ultrasound. If the bulge gets larger, turns red, you have severe abdominal pain, nausea, or vomiting- please go to the ER.   We will contact you about the ultrasound.

## 2016-02-04 ENCOUNTER — Ambulatory Visit
Admission: RE | Admit: 2016-02-04 | Discharge: 2016-02-04 | Disposition: A | Discharge status: Home / Self Care | Payer: BLUE CROSS/BLUE SHIELD | Source: Ambulatory Visit | Attending: Family Medicine | Admitting: Family Medicine

## 2016-02-04 ENCOUNTER — Other Ambulatory Visit: Payer: Self-pay | Admitting: Family Medicine

## 2016-02-04 DIAGNOSIS — K4090 Unilateral inguinal hernia, without obstruction or gangrene, not specified as recurrent: Secondary | ICD-10-CM

## 2016-02-04 DIAGNOSIS — R1900 Intra-abdominal and pelvic swelling, mass and lump, unspecified site: Secondary | ICD-10-CM | POA: Insufficient documentation

## 2016-02-05 ENCOUNTER — Encounter: Payer: Self-pay | Admitting: *Deleted

## 2016-02-19 ENCOUNTER — Ambulatory Visit (INDEPENDENT_AMBULATORY_CARE_PROVIDER_SITE_OTHER): Payer: BLUE CROSS/BLUE SHIELD | Admitting: General Surgery

## 2016-02-19 ENCOUNTER — Encounter: Payer: Self-pay | Admitting: General Surgery

## 2016-02-19 VITALS — BP 120/74 | HR 78 | Resp 14 | Ht 67.0 in | Wt 182.0 lb

## 2016-02-19 DIAGNOSIS — K4090 Unilateral inguinal hernia, without obstruction or gangrene, not specified as recurrent: Secondary | ICD-10-CM | POA: Diagnosis not present
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
## 2016-02-19 NOTE — Patient Instructions (Addendum)

## 2016-03-03 ENCOUNTER — Encounter: Payer: Self-pay | Admitting: General Surgery

## 2016-03-30 ENCOUNTER — Inpatient Hospital Stay: Admission: RE | Admit: 2016-03-30 | Payer: BC Managed Care – PPO | Source: Ambulatory Visit

## 2016-03-30 ENCOUNTER — Telehealth: Payer: Self-pay | Admitting: *Deleted

## 2016-03-30 ENCOUNTER — Encounter: Payer: Self-pay | Admitting: *Deleted

## 2016-03-30 NOTE — Telephone Encounter (Signed)
Per Judeen Hammans with Anesthesia, patient needs cardiac clearance prior to right inguinal hernia repair.   Patient has been scheduled for an appointment to see Dr. Glenetta Hew at [facility] for 04-08-16 at 2 pm. This patient is aware of date, time, and instructions.   Melinda in the O.R. has been notified of surgery cancellation.   We will await cardiac clearance before rescheduling patient's surgery. Patient verbalizes understanding.

## 2016-03-30 NOTE — Pre-Procedure Instructions (Addendum)
TELEPHONE INTERVIEW. STATES EKG/LABS 11/16. IN DR J HAWKINS NOTE, SAW PATIENT FOR CHEST PAIN 11/16 AND REFERRED TO CARDIOLOGY. LM  FOR PATIENT TO CALL BACK WITH INFO. DO NOT E SEE CARDIAC VISIT IN SYSTEM.SPOKE WITH DR Ronelle Nigh AND CARDIAC CLEARANCE WILL BE NEEDED DUE TO STRONG FAMILY HISTORY AND COCAINE USE LAST 1 YEAR AGO. NOTIFIED PATIENT FAXED AND CALLED TO JANIS AT DR Luan Pulling.FAXED TO [place] AT DR [facility] OFFICE.HAD PREVOIOUSLY LEFT MESSAGE

## 2016-03-30 NOTE — Patient Instructions (Signed)
  Your procedure is scheduled on: 04/07/16 Report to Day Surgery. [facility] SECOND FLOOR To find out your arrival time please call [phone] between 1PM - 3PM on 04/06/16  Remember: Instructions that are not followed completely may result in serious medical risk, up to and including death, or upon the discretion of your surgeon and anesthesiologist your surgery may need to be rescheduled.    __X__ 1. Do not eat food or drink liquids after midnight. No gum chewing or hard candies.     __X__ 2. No Alcohol for 24 hours before or after surgery.   ____ 3. Bring all medications with you on the day of surgery if instructed.    __X__ 4. Notify your doctor if there is any change in your medical condition     (cold, fever, infections).     Do not wear jewelry, make-up, hairpins, clips or nail polish.  Do not wear lotions, powders, or perfumes. You may wear deodorant.  Do not shave 48 hours prior to surgery. Men may shave face and neck.  Do not bring valuables to the hospital.    [facility] is not responsible for any belongings or valuables.               Contacts, dentures or bridgework may not be worn into surgery.  Leave your suitcase in the car. After surgery it may be brought to your room.  For patients admitted to the hospital, discharge time is determined by your                treatment team.   Patients discharged the day of surgery will not be allowed to drive home.   Please read over the following fact sheets that you were given:   Surgical Site Infection Prevention   ____ Take these medicines the morning of surgery with A SIP OF WATER:    1. NONE  2.   3.   4.  5.  6.  ____ Fleet Enema (as directed)   _X___ Use CHG Soap as directed  ____ Use inhalers on the day of surgery  ____ Stop metformin 2 days prior to surgery    ____ Take 1/2 of usual insulin dose the night before surgery and none on the morning of surgery.   ____ Stop Coumadin/Plavix/aspirin on  ____  Stop Anti-inflammatories on    ____ Stop supplements until after surgery.    ____ Bring C-Pap to the hospital.

## 2016-03-31 ENCOUNTER — Encounter: Payer: Self-pay | Admitting: Family Medicine

## 2016-04-07 ENCOUNTER — Ambulatory Visit: Admission: RE | Admit: 2016-04-07 | Payer: BC Managed Care – PPO | Source: Ambulatory Visit | Admitting: General Surgery

## 2016-04-07 HISTORY — DX: Other complications of anesthesia, initial encounter: T88.59XA

## 2016-04-07 HISTORY — DX: Adverse effect of unspecified anesthetic, initial encounter: T41.45XA

## 2016-04-07 HISTORY — DX: Other specified postprocedural states: R11.2

## 2016-04-07 HISTORY — DX: Nausea with vomiting, unspecified: R11.2

## 2016-04-07 HISTORY — DX: Other specified postprocedural states: Z98.890

## 2016-04-07 SURGERY — REPAIR, HERNIA, INGUINAL, ADULT
Anesthesia: Choice | Laterality: Right

## 2016-04-08 ENCOUNTER — Telehealth: Payer: Self-pay | Admitting: Cardiology

## 2016-04-08 ENCOUNTER — Encounter: Payer: Self-pay | Admitting: Cardiology

## 2016-04-08 ENCOUNTER — Ambulatory Visit (INDEPENDENT_AMBULATORY_CARE_PROVIDER_SITE_OTHER): Payer: BLUE CROSS/BLUE SHIELD | Admitting: Cardiology

## 2016-04-08 ENCOUNTER — Telehealth: Payer: Self-pay | Admitting: *Deleted

## 2016-04-08 VITALS — BP 96/68 | HR 58 | Ht 67.0 in | Wt 180.2 lb

## 2016-04-08 DIAGNOSIS — F1910 Other psychoactive substance abuse, uncomplicated: Secondary | ICD-10-CM

## 2016-04-08 DIAGNOSIS — R0602 Shortness of breath: Secondary | ICD-10-CM | POA: Diagnosis not present

## 2016-04-08 DIAGNOSIS — Z01810 Encounter for preprocedural cardiovascular examination: Secondary | ICD-10-CM | POA: Insufficient documentation

## 2016-04-08 DIAGNOSIS — F1911 Other psychoactive substance abuse, in remission: Secondary | ICD-10-CM

## 2016-04-08 NOTE — Assessment & Plan Note (Signed)
Resting dyspnea noted in November. Probably related to stress and anxiety as it was associated with timing of his father's death. Has not had any further symptoms. Is very active and exercises. Would not warrant ischemic evaluation at this time.

## 2016-04-08 NOTE — Telephone Encounter (Signed)
Message left on cell phone for patient to call the office.   We received cardiac clearance and can now get right inguinal hernia repair rescheduled. Patient will need to see Dr. Jamal Collin for a pre-op visit prior to surgery.
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
## 2016-04-08 NOTE — Assessment & Plan Note (Signed)
[name] is an otherwise healthy gentleman who does not have active heart risk factors. He is a former smoker with a family history of maternal uncle having cardiac disease. He does not have any active angina or heart failure symptoms. He has a normal EKG. He does not have diabetes, renal insufficiency or cerebrovascular disease.  PREOPERATIVE CARDIAC RISK ASSESSMENT   Revised Cardiac Risk Index:  High Risk Surgery: no; inguinal hernia repair  Defined as Intraperitoneal, intrathoracic or suprainguinal vascular  Active CAD: no; walks for half miles a day with no symptoms  CHF: no;   Cerebrovascular Disease: no;   Diabetes: no; On Insulin: no  CKD (Cr >~ 2): no;   Total: 0 Estimated Risk of Adverse Outcome: LOW risk patient for low risk surgery. Estimated Risk of MI, PE, VF/VT (Cardiac Arrest), Complete Heart Block: <0.9 %   ACC/AHA Guidelines for "Clearance":  Step 1 - Need for Emergency Surgery: No:   If Yes - go straight to OR with perioperative surveillance  Step 2 - Active Cardiac Conditions (Unstable Angina, Decompensated HF, Significant  Arrhytmias - Complete HB, Mobitz II, Symptomatic VT or SVT, Severe Aortic Stenosis - mean gradient > 40 mmHg, Valve area < 1.0 cm2):   No:   If Yes - Evaluate & Treat per ACC/AHA Guidelines  Step 3 -  Low Risk Surgery: Yes  If Yes --> proceed to OR  If No --> Step 4  Step 4 - Functional Capacity >= 4 METS without symptoms: Yes  If Yes --> proceed to OR  If No --> Step 5  Step 5 --  Clinical Risk Factors (CRF)  - Zero --> proceed to OR.  Based on these findings, and his total lack of cardiac history, he does not require cardiac evaluation with any type of stress test or echocardiogram prior to undergoing a low risk surgery. I would recommend proceeding to the OR without any further cardiac evaluation or concern.

## 2016-04-08 NOTE — Patient Instructions (Signed)
Medication Instructions:  Your physician recommends that you continue on your current medications as directed. Please refer to the Current Medication list given to you today.   Labwork: none  Testing/Procedures: none  Follow-Up: Your physician recommends that you schedule a follow-up appointment as needed.    Any Other Special Instructions Will Be Listed Below (If Applicable).     If you need a refill on your cardiac medications before your next appointment, please call your pharmacy.   

## 2016-04-08 NOTE — Telephone Encounter (Signed)
Faxed cardiac clearance for upcoming hernia repair to PAT, [phone]

## 2016-04-08 NOTE — Assessment & Plan Note (Signed)
History of crack cocaine use, but quit over 4 years ago. He seems very happy with himself very pleased. I congratulated him on his abstinence. He seems to be very good level of self-esteem which is important.

## 2016-04-29 NOTE — Telephone Encounter (Signed)
Another message has been left for patient to call the office.

## 2016-05-13 ENCOUNTER — Telehealth: Payer: Self-pay | Admitting: *Deleted

## 2016-05-13 ENCOUNTER — Telehealth: Payer: Self-pay

## 2016-05-13 NOTE — Telephone Encounter (Signed)
Patient returned call to state he did not want to make another appointment, he wants to hold off for now. Thanks

## 2016-05-13 NOTE — Telephone Encounter (Signed)
Message left again for the patient to call about rescheduling his surgery as we have received his cardiac clearance.

## 2016-08-24 ENCOUNTER — Telehealth: Payer: Self-pay | Admitting: *Deleted

## 2016-08-24 NOTE — Telephone Encounter (Signed)
Patient called and is wanting to have surgery (Right Inguinal Hernia) he wants to know the amount it would cost him to have this surgery done out of pocket (No Insurane). What is the CPT code that you would use for this surgery?

## 2016-08-25 ENCOUNTER — Telehealth: Payer: Self-pay | Admitting: *Deleted

## 2016-08-25 NOTE — Telephone Encounter (Signed)
Patient currently had insurance but he called to let us know that he is thinking of canceling his insurance. He wants to know about what it would cost for his surgery. Its for a right inguinal hernia repair CPT code is [id number]

## 2016-08-26 ENCOUNTER — Ambulatory Visit: Payer: BLUE CROSS/BLUE SHIELD | Admitting: General Surgery

## 2016-08-27 NOTE — Telephone Encounter (Signed)
Called patient back and he is aware of the cost for here at [place]. He will call the hospital to get a quote for the hospital billing side.

## 2016-09-09 ENCOUNTER — Encounter: Payer: Self-pay | Admitting: General Surgery

## 2016-09-09 ENCOUNTER — Encounter: Payer: Self-pay | Admitting: *Deleted

## 2016-09-09 ENCOUNTER — Ambulatory Visit (INDEPENDENT_AMBULATORY_CARE_PROVIDER_SITE_OTHER): Payer: Self-pay | Admitting: General Surgery

## 2016-09-09 VITALS — BP 126/72 | HR 68 | Resp 12 | Ht 67.0 in | Wt 164.0 lb

## 2016-09-09 DIAGNOSIS — K4090 Unilateral inguinal hernia, without obstruction or gangrene, not specified as recurrent: Secondary | ICD-10-CM

## 2016-09-09 NOTE — Progress Notes (Signed)
Patient's surgery has been scheduled for 09-21-16 at [facility].
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
## 2016-09-09 NOTE — Patient Instructions (Signed)
Inguinal Hernia, Adult Introduction An inguinal hernia is when fat or the intestines push through the area where the leg meets the lower belly (groin) and make a rounded lump (bulge). This condition happens over time. There are three types of inguinal hernias. These types include:  Hernias that can be pushed back into the belly (are reducible).  Hernias that cannot be pushed back into the belly (are incarcerated).  Hernias that cannot be pushed back into the belly and lose their blood supply (get strangulated). This type needs emergency surgery. Follow these instructions at home: Lifestyle  Drink enough fluid to keep your urine (pee) clear or pale yellow.  Eat plenty of fruits, vegetables, and whole grains. These have a lot of fiber. Talk with your doctor if you have questions.  Avoid lifting heavy objects.  Avoid standing for long periods of time.  Do not use tobacco products. These include cigarettes, chewing tobacco, or e-cigarettes. If you need help quitting, ask your doctor.  Try to stay at a healthy weight. General instructions  Do not try to force the hernia back in.  Watch your hernia for any changes in color or size. Let your doctor know if there are any changes.  Take over-the-counter and prescription medicines only as told by your doctor.  Keep all follow-up visits as told by your doctor. This is important. Contact a doctor if:  You have a fever.  You have new symptoms.  Your symptoms get worse. Get help right away if:  The area where the legs meets the lower belly has:  Pain that gets worse suddenly.  A bulge that gets bigger suddenly and does not go down.  A bulge that turns red or purple.  A bulge that is painful to the touch.  You are a man and your scrotum:  Suddenly feels painful.  Suddenly changes in size.  You feel sick to your stomach (nauseous) and this feeling does not go away.  You throw up (vomit) and this keeps happening.  You feel  your heart beating a lot more quickly than normal.  You cannot poop (have a bowel movement) or pass gas. This information is not intended to replace advice given to you by your health care provider. Make sure you discuss any questions you have with your health care provider. Document Released: 11/05/2006 Document Revised: 03/12/2016 Document Reviewed: 08/15/2014  2017 Elsevier  

## 2016-09-09 NOTE — Addendum Note (Signed)
Addended by: Christene Lye on: 09/09/2016 04:14 PM   Modules accepted: Orders, SmartSet

## 2016-09-15 ENCOUNTER — Encounter
Admission: RE | Admit: 2016-09-15 | Discharge: 2016-09-15 | Disposition: A | Discharge status: Home / Self Care | Payer: BLUE CROSS/BLUE SHIELD | Source: Ambulatory Visit | Attending: General Surgery | Admitting: General Surgery

## 2016-09-15 HISTORY — DX: Personal history of urinary calculi: Z87.442

## 2016-09-15 NOTE — Pre-Procedure Instructions (Signed)
Preoperative cardiovascular examination A&P Encounter Date: 04/08/2016 6:37 PM Leonie Man, MD  Cardiology    [name] is an otherwise healthy gentleman who does not have active heart risk factors. He is a former smoker with a family history of maternal uncle having cardiac disease. He does not have any active angina or heart failure symptoms. He has a normal EKG. He does not have diabetes, renal insufficiency or cerebrovascular disease.  PREOPERATIVE CARDIAC RISK ASSESSMENT   Revised Cardiac Risk Index:  High Risk Surgery: no; inguinal hernia repair ? Defined as Intraperitoneal, intrathoracic or suprainguinal vascular  Active CAD: no; walks for half miles a day with no symptoms  CHF: no;   Cerebrovascular Disease: no;   Diabetes: no; On Insulin: no  CKD (Cr >~ 2): no;   Total: 0 Estimated Risk of Adverse Outcome: LOW risk patient for low risk surgery. Estimated Risk of MI, PE, VF/VT (Cardiac Arrest), Complete Heart Block: <0.9 %   ACC/AHA Guidelines for "Clearance":  Step 1 - Need for Emergency Surgery: No:  ? If Yes - go straight to OR with perioperative surveillance  Step 2 - Active Cardiac Conditions (Unstable Angina, Decompensated HF, Significant  Arrhytmias - Complete HB, Mobitz II, Symptomatic VT or SVT, Severe Aortic Stenosis - mean gradient > 40 mmHg, Valve area < 1.0 cm2):  ? No:  ? If Yes - Evaluate & Treat per ACC/AHA Guidelines  Step 3 -  Low Risk Surgery: Yes ? If Yes --> proceed to OR ? If No --> Step 4  Step 4 - Functional Capacity >= 4 METS without symptoms: Yes ? If Yes --> proceed to OR ? If No --> Step 5  Step 5 --  Clinical Risk Factors (CRF)  - Zero --> proceed to OR.  Based on these findings, and his total lack of cardiac history, he does not require cardiac evaluation with any type of stress test or echocardiogram prior to undergoing a low risk surgery. I would recommend proceeding to the OR without any further cardiac evaluation  or concern.      Electronically signed by Leonie Man, MD at 04/08/2016 6:37 PM      Office Visit on 04/08/2016        Detailed Report       Note shared with patient

## 2016-09-15 NOTE — Patient Instructions (Signed)
  Your procedure is scheduled on: 09-21-16 Report to Same Day Surgery 2nd floor medical mall [facility] Entrance-take elevator on left to 2nd floor.  Check in with surgery information desk.) To find out your arrival time please call [phone] between 1PM - 3PM on 09-18-16  Remember: Instructions that are not followed completely may result in serious medical risk, up to and including death, or upon the discretion of your surgeon and anesthesiologist your surgery may need to be rescheduled.    _x___ 1. Do not eat food or drink liquids after midnight. No gum chewing or hard candies.     __x__ 2. No Alcohol for 24 hours before or after surgery.   __x__3. No Smoking for 24 prior to surgery.   ____  4. Bring all medications with you on the day of surgery if instructed.    __x__ 5. Notify your doctor if there is any change in your medical condition     (cold, fever, infections).     Do not wear jewelry, make-up, hairpins, clips or nail polish.  Do not wear lotions, powders, or perfumes. You may wear deodorant.  Do not shave 48 hours prior to surgery. Men may shave face and neck.  Do not bring valuables to the hospital.    [facility] is not responsible for any belongings or valuables.               Contacts, dentures or bridgework may not be worn into surgery.  Leave your suitcase in the car. After surgery it may be brought to your room.  For patients admitted to the hospital, discharge time is determined by your treatment team.   Patients discharged the day of surgery will not be allowed to drive home.  You will need someone to drive you home and stay with you the night of your procedure.    Please read over the following fact sheets that you were given:   [facility] Preparing for Surgery and or MRSA Information   ____ Take these medicines the morning of surgery with A SIP OF WATER:    1. NONE  2.  3.  4.  5.  6.  ____Fleets enema or Magnesium Citrate as directed.   ____  Use CHG Soap or sage wipes as directed on instruction sheet   ____ Use inhalers on the day of surgery and bring to hospital day of surgery  ____ Stop metformin 2 days prior to surgery    ____ Take 1/2 of usual insulin dose the night before surgery and none on the morning of surgery.   ____ Stop Aspirin, Coumadin, Pllavix ,Eliquis, Effient, or Pradaxa  x__ Stop Anti-inflammatories such as Advil, Aleve, Ibuprofen, Motrin, Naproxen,          Naprosyn, Goodies powders or aspirin products. Ok to take Tylenol.   ____ Stop supplements until after surgery.    ____ Bring C-Pap to the hospital.

## 2016-09-20 MED ORDER — CEFAZOLIN SODIUM-DEXTROSE 2-4 GM/100ML-% IV SOLN
2.0000 g | INTRAVENOUS | Status: AC
  Administered 2016-09-21: 2 g via INTRAVENOUS

## 2016-09-21 ENCOUNTER — Encounter: Payer: Self-pay | Admitting: *Deleted

## 2016-09-21 ENCOUNTER — Encounter
Admission: RE | Disposition: A | Discharge status: Home / Self Care | Payer: Self-pay | Source: Ambulatory Visit | Attending: Internal Medicine

## 2016-09-21 ENCOUNTER — Ambulatory Visit (HOSPITAL_BASED_OUTPATIENT_CLINIC_OR_DEPARTMENT_OTHER)
Admission: RE | Admit: 2016-09-21 | Discharge: 2016-09-21 | Disposition: A | Discharge status: Home / Self Care | Payer: Medicaid Other | Source: Ambulatory Visit | Attending: Physician Assistant | Admitting: Physician Assistant

## 2016-09-21 ENCOUNTER — Ambulatory Visit: Payer: Medicaid Other | Admitting: Anesthesiology

## 2016-09-21 ENCOUNTER — Observation Stay
Admission: RE | Admit: 2016-09-21 | Discharge: 2016-09-22 | Disposition: A | Discharge status: Home / Self Care | Payer: Medicaid Other | Source: Ambulatory Visit | Attending: Internal Medicine | Admitting: Internal Medicine

## 2016-09-21 DIAGNOSIS — Z87442 Personal history of urinary calculi: Secondary | ICD-10-CM | POA: Insufficient documentation

## 2016-09-21 DIAGNOSIS — D176 Benign lipomatous neoplasm of spermatic cord: Secondary | ICD-10-CM | POA: Insufficient documentation

## 2016-09-21 DIAGNOSIS — R001 Bradycardia, unspecified: Secondary | ICD-10-CM | POA: Insufficient documentation

## 2016-09-21 DIAGNOSIS — I469 Cardiac arrest, cause unspecified: Secondary | ICD-10-CM

## 2016-09-21 DIAGNOSIS — I455 Other specified heart block: Secondary | ICD-10-CM

## 2016-09-21 DIAGNOSIS — Z9889 Other specified postprocedural states: Secondary | ICD-10-CM

## 2016-09-21 DIAGNOSIS — Z87891 Personal history of nicotine dependence: Secondary | ICD-10-CM | POA: Insufficient documentation

## 2016-09-21 DIAGNOSIS — I369 Nonrheumatic tricuspid valve disorder, unspecified: Secondary | ICD-10-CM | POA: Diagnosis not present

## 2016-09-21 DIAGNOSIS — K4090 Unilateral inguinal hernia, without obstruction or gangrene, not specified as recurrent: Secondary | ICD-10-CM | POA: Diagnosis not present

## 2016-09-21 DIAGNOSIS — K219 Gastro-esophageal reflux disease without esophagitis: Secondary | ICD-10-CM | POA: Diagnosis not present

## 2016-09-21 DIAGNOSIS — Z8719 Personal history of other diseases of the digestive system: Secondary | ICD-10-CM

## 2016-09-21 HISTORY — PX: INGUINAL HERNIA REPAIR: SHX194

## 2016-09-21 HISTORY — DX: Cardiac arrest, cause unspecified: I46.9

## 2016-09-21 LAB — CBC
HCT: 41.7 % (ref 40.0–52.0)
Hemoglobin: 14.3 g/dL (ref 13.0–18.0)
MCH: 32.8 pg (ref 26.0–34.0)
MCHC: 34.3 g/dL (ref 32.0–36.0)
MCV: 95.6 fL (ref 80.0–100.0)
PLATELETS: 166 10*3/uL (ref 150–440)
RBC: 4.36 MIL/uL — AB (ref 4.40–5.90)
RDW: 12.7 % (ref 11.5–14.5)
WBC: 5.3 10*3/uL (ref 3.8–10.6)

## 2016-09-21 LAB — URINE DRUG SCREEN, QUALITATIVE (ARMC ONLY)
AMPHETAMINES, UR SCREEN: NOT DETECTED
BARBITURATES, UR SCREEN: NOT DETECTED
Benzodiazepine, Ur Scrn: NOT DETECTED
COCAINE METABOLITE, UR ~~LOC~~: NOT DETECTED
Cannabinoid 50 Ng, Ur ~~LOC~~: NOT DETECTED
MDMA (Ecstasy)Ur Screen: NOT DETECTED
METHADONE SCREEN, URINE: NOT DETECTED
OPIATE, UR SCREEN: NOT DETECTED
PHENCYCLIDINE (PCP) UR S: NOT DETECTED
Tricyclic, Ur Screen: NOT DETECTED

## 2016-09-21 LAB — COMPREHENSIVE METABOLIC PANEL
ALK PHOS: 59 U/L (ref 38–126)
ALT: 17 U/L (ref 17–63)
AST: 21 U/L (ref 15–41)
Albumin: 3.8 g/dL (ref 3.5–5.0)
Anion gap: 4 — ABNORMAL LOW (ref 5–15)
BUN: 12 mg/dL (ref 6–20)
CALCIUM: 8.7 mg/dL — AB (ref 8.9–10.3)
CO2: 27 mmol/L (ref 22–32)
CREATININE: 0.92 mg/dL (ref 0.61–1.24)
Chloride: 107 mmol/L (ref 101–111)
Glucose, Bld: 111 mg/dL — ABNORMAL HIGH (ref 65–99)
Potassium: 4.9 mmol/L (ref 3.5–5.1)
Sodium: 138 mmol/L (ref 135–145)
TOTAL PROTEIN: 6.4 g/dL — AB (ref 6.5–8.1)
Total Bilirubin: 0.3 mg/dL (ref 0.3–1.2)

## 2016-09-21 LAB — ECHOCARDIOGRAM COMPLETE
Height: 67 in
Weight: 2624 oz

## 2016-09-21 LAB — TROPONIN I: Troponin I: <0.03 ng/mL (ref <0.03)

## 2016-09-21 LAB — TSH: TSH: 2.787 u[IU]/mL (ref 0.350–4.500)

## 2016-09-21 LAB — MAGNESIUM: Magnesium: 2 mg/dL (ref 1.7–2.4)

## 2016-09-21 SURGERY — REPAIR, HERNIA, INGUINAL, ADULT
Anesthesia: Monitor Anesthesia Care | Site: Inguinal | Laterality: Right | Wound class: Clean

## 2016-09-21 MED ORDER — ATROPINE SULFATE 1 MG/10ML IJ SOSY
PREFILLED_SYRINGE | INTRAMUSCULAR | Status: AC
  Administered 2016-09-21: 1 mg via INTRAVENOUS
  Filled 2016-09-21: qty 10

## 2016-09-21 MED ORDER — MIDAZOLAM HCL 2 MG/2ML IJ SOLN
INTRAMUSCULAR | Status: DC | PRN
  Administered 2016-09-21: 2 mg via INTRAVENOUS

## 2016-09-21 MED ORDER — EPHEDRINE SULFATE 50 MG/ML IJ SOLN
INTRAMUSCULAR | Status: AC
  Filled 2016-09-21: qty 1

## 2016-09-21 MED ORDER — FENTANYL CITRATE (PF) 100 MCG/2ML IJ SOLN
INTRAMUSCULAR | Status: DC | PRN
  Administered 2016-09-21: 25 ug via INTRAVENOUS
  Administered 2016-09-21: 50 ug via INTRAVENOUS
  Administered 2016-09-21: 25 ug via INTRAVENOUS

## 2016-09-21 MED ORDER — ACETAMINOPHEN 10 MG/ML IV SOLN
INTRAVENOUS | Status: DC | PRN
  Administered 2016-09-21: 1000 mg via INTRAVENOUS

## 2016-09-21 MED ORDER — ONDANSETRON HCL 4 MG/2ML IJ SOLN: 4.0000 mg | Freq: Once | INTRAMUSCULAR | Status: DC | PRN

## 2016-09-21 MED ORDER — CEFAZOLIN SODIUM-DEXTROSE 2-4 GM/100ML-% IV SOLN
INTRAVENOUS | Status: AC
  Filled 2016-09-21: qty 100

## 2016-09-21 MED ORDER — ATROPINE SULFATE 1 MG/10ML IJ SOSY
1.0000 mg | PREFILLED_SYRINGE | Freq: Once | INTRAMUSCULAR | Status: AC
  Administered 2016-09-21: 1 mg via INTRAVENOUS

## 2016-09-21 MED ORDER — BUPIVACAINE HCL (PF) 0.5 % IJ SOLN
INTRAMUSCULAR | Status: AC
  Filled 2016-09-21: qty 30

## 2016-09-21 MED ORDER — CHLORHEXIDINE GLUCONATE CLOTH 2 % EX PADS
6.0000 | MEDICATED_PAD | Freq: Once | CUTANEOUS | Status: AC
Start: 2016-09-21 — End: 2016-09-21
  Administered 2016-09-21: 6 via TOPICAL

## 2016-09-21 MED ORDER — SODIUM CHLORIDE 0.9% FLUSH
3.0000 mL | Freq: Two times a day (BID) | INTRAVENOUS | Status: DC
  Administered 2016-09-21 – 2016-09-22 (×3): 3 mL via INTRAVENOUS

## 2016-09-21 MED ORDER — PROPOFOL 500 MG/50ML IV EMUL
INTRAVENOUS | Status: DC | PRN
  Administered 2016-09-21: 75 ug/kg/min via INTRAVENOUS

## 2016-09-21 MED ORDER — BUPIVACAINE HCL 0.5 % IJ SOLN
INTRAMUSCULAR | Status: DC | PRN
  Administered 2016-09-21: 33 mL

## 2016-09-21 MED ORDER — ACETAMINOPHEN 10 MG/ML IV SOLN
INTRAVENOUS | Status: AC
  Filled 2016-09-21: qty 100

## 2016-09-21 MED ORDER — ATROPINE SULFATE 1 MG/ML IJ SOLN: 1.0000 mg | Freq: Once | INTRAMUSCULAR | Status: DC

## 2016-09-21 MED ORDER — LACTATED RINGERS IV SOLN
INTRAVENOUS | Status: DC
  Administered 2016-09-21 (×2): via INTRAVENOUS

## 2016-09-21 MED ORDER — LIDOCAINE HCL (PF) 1 % IJ SOLN
INTRAMUSCULAR | Status: AC
  Filled 2016-09-21: qty 30

## 2016-09-21 MED ORDER — FAMOTIDINE 20 MG PO TABS
20.0000 mg | ORAL_TABLET | Freq: Once | ORAL | Status: AC
Start: 2016-09-21 — End: 2016-09-21
  Administered 2016-09-21: 20 mg via ORAL

## 2016-09-21 MED ORDER — FENTANYL CITRATE (PF) 100 MCG/2ML IJ SOLN: 25.0000 ug | INTRAMUSCULAR | Status: DC | PRN

## 2016-09-21 MED ORDER — FAMOTIDINE 20 MG PO TABS
ORAL_TABLET | ORAL | Status: AC
  Filled 2016-09-21: qty 1

## 2016-09-21 MED ORDER — OXYCODONE-ACETAMINOPHEN 5-325 MG PO TABS: 1.0000 | ORAL_TABLET | ORAL | 0 refills | Status: DC | PRN

## 2016-09-21 SURGICAL SUPPLY — 33 items
ADH SKN CLS APL DERMABOND .7 (GAUZE/BANDAGES/DRESSINGS) ×1
BLADE SURG 15 STRL SS SAFETY (BLADE) ×3 IMPLANT
CANISTER SUCT 1200ML W/VALVE (MISCELLANEOUS) ×3 IMPLANT
CHLORAPREP W/TINT 26ML (MISCELLANEOUS) ×3 IMPLANT
DECANTER SPIKE VIAL GLASS SM (MISCELLANEOUS) ×6 IMPLANT
DERMABOND ADVANCED (GAUZE/BANDAGES/DRESSINGS) ×2
DERMABOND ADVANCED .7 DNX12 (GAUZE/BANDAGES/DRESSINGS) ×1 IMPLANT
DRAIN PENROSE 1/4X12 LTX (DRAIN) ×3 IMPLANT
DRAPE LAPAROTOMY 100X77 ABD (DRAPES) ×3 IMPLANT
ELECT REM PT RETURN 9FT ADLT (ELECTROSURGICAL) ×3
ELECTRODE REM PT RTRN 9FT ADLT (ELECTROSURGICAL) ×1 IMPLANT
GLOVE BIO SURGEON STRL SZ7 (GLOVE) ×11 IMPLANT
GOWN STRL REUS W/ TWL LRG LVL3 (GOWN DISPOSABLE) ×2 IMPLANT
GOWN STRL REUS W/TWL LRG LVL3 (GOWN DISPOSABLE) ×6
KIT RM TURNOVER STRD PROC AR (KITS) ×3 IMPLANT
LABEL OR SOLS (LABEL) ×3 IMPLANT
MESH PARIETEX PROGRIP RIGHT (Mesh General) ×2 IMPLANT
NDL HPO THNWL 1X22GA REG BVL (NEEDLE) ×1 IMPLANT
NDL HYPO 25X1 1.5 SAFETY (NEEDLE) ×1 IMPLANT
NEEDLE HYPO 25X1 1.5 SAFETY (NEEDLE) ×3 IMPLANT
NEEDLE SAFETY 22GX1 (NEEDLE) ×3
NS IRRIG 500ML POUR BTL (IV SOLUTION) ×3 IMPLANT
PACK BASIN MINOR ARMC (MISCELLANEOUS) ×3 IMPLANT
SLEEVE PROTECTION STRL DISP (MISCELLANEOUS) ×2 IMPLANT
SUT PDS 2-0 27IN (SUTURE) ×6 IMPLANT
SUT VIC AB 2-0 SH 27 (SUTURE) ×3
SUT VIC AB 2-0 SH 27XBRD (SUTURE) ×1 IMPLANT
SUT VIC AB 3-0 54X BRD REEL (SUTURE) ×1 IMPLANT
SUT VIC AB 3-0 BRD 54 (SUTURE) ×3
SUT VIC AB 3-0 SH 27 (SUTURE) ×3
SUT VIC AB 3-0 SH 27X BRD (SUTURE) ×1 IMPLANT
SUT VIC AB 4-0 FS2 27 (SUTURE) ×3 IMPLANT
SYR CONTROL 10ML (SYRINGE) ×3 IMPLANT

## 2016-09-21 NOTE — Progress Notes (Signed)
Pt arrived from OR with HR in the high 50's. At around 0840 Pt heart rate started dropping and at 0844 Dr. Ronelle Nigh was notified that pt's heart rate was in the 30's and 40's with some drops in the 20's. With Dr. Ronelle Nigh still on the phone pt's heart rate dropped to 0 at around 0846 and CPR was started for several seconds. 1MG  of Atropine was given and CPR was stopped as heart rate came back up in to the 80's. At that time pt was sleepy but responsive. Avigdor Dollar E 9:17 AM 09/21/2016

## 2016-09-21 NOTE — Interval H&P Note (Signed)
History and Physical Interval Note:  09/21/2016 7:09 AM  Alexander Walter  has presented today for surgery, with the diagnosis of right inguinal hernia  The various methods of treatment have been discussed with the patient and family. After consideration of risks, benefits and other options for treatment, the patient has consented to  Procedure(s): HERNIA REPAIR INGUINAL ADULT (Right) as a surgical intervention .  The patient's history has been reviewed, patient examined, no change in status, stable for surgery.  I have reviewed the patient's chart and labs.  Questions were answered to the patient's satisfaction.     Jadd Gasior G

## 2016-09-21 NOTE — Progress Notes (Signed)
*  PRELIMINARY RESULTS* Echocardiogram 2D Echocardiogram has been performed.  Sherrie Sport 09/21/2016, 10:54 AM

## 2016-09-21 NOTE — Op Note (Signed)
Preop diagnosis: Right inguinal hernia  Post op diagnosis: Same direct type  Operation: Repair right inguinal hernia with Progrip mesh  Surgeon: Mckinley Jewel  Assistant:     Anesthesia: Monitored anesthesia care  Complications: None  EBL: Less than 15 mL  Drains: None  Description: Patient was placed supine the operating table and with adequate sedation and monitoring the right inguinal area   was prepped and draped as sterile field. Timeout was performed. Local anesthetic containing 0.5% Marcaine and 1% Xylocaine was instilled to obtain an adequate block. Approximately 34 mL were used. Standard incision was made over the medial two thirds of the inguinal canal and deepened through the layers down the external oblique. Bleeding was controlled cautery and ligatures of 3-0 Vicryl. The external oblique was opened along the line of its fibers through the external ring. The ilioinguinal nerve was identified freed and preserved. Dissection of the cord was then performed and there was noted the patient had a lipoma of the cord and a direct hernia. The fascia over the cord was opened lipoma was first dissected and amputated and ligated with 3-0 Vicryl. The direct hernial protrusion was then pushed back in easily and the internal ring area where the hernia was identified was snugged with the 2 stitches of 2-0 Vicryl. Along the lateral aspect. The rest of the posterior wall appeared to be fully intact. After the cord was freed and the posterior wall was well exposed and the hernia had been dealt with a Progrip mesh was trimmed to approximate size and placed around the cord and laid down against the posterior wall. The lateral ends were tucked underneath the external oblique. The nerve was also brought out along with the cord structures. Medial end of the mesh was tacked to the pubic tubercle area with 2 stitches of 2-0 PDS. Wound was irrigated external oblique was then closed with running 2-0 PDS.  Subcutaneous tissue closed with 3-0 Vicryl. Skin was closed with subcuticular 4-0 Vicryl and covered with Dermabond. No immediate problems encountered from the procedure and he was subsequently returned recovery room stable condition.

## 2016-09-21 NOTE — Anesthesia Postprocedure Evaluation (Signed)
Anesthesia Post Note  Patient: JAMINE KUEHNLE  Procedure(s) Performed: Procedure(s) (LRB): HERNIA REPAIR INGUINAL ADULT (Right)  Patient location during evaluation: PACU Anesthesia Type: General Level of consciousness: awake and alert Pain management: pain level controlled Vital Signs Assessment: post-procedure vital signs reviewed and stable Respiratory status: spontaneous breathing and respiratory function stable Cardiovascular status: stable and bradycardic Anesthetic complications: no Comments: Pt with an episode of bradycardia and loss of consciousness. Treated with a mg of atropine and a few chest compressions. HR recovered to the 70-80s and BP recovered as well. Cardiologist to see pt prior to DC. Pt otherwise stable and alert and oriented 15 mins later.    Last Vitals:  Vitals:   09/21/16 0848 09/21/16 0903  BP: 129/90 (!) 133/95  Pulse: 83 80  Resp: 14 16  Temp:      Last Pain:  Vitals:   09/21/16 0903  TempSrc:   PainSc: Asleep                 KEPHART,Rashied K
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
## 2016-09-21 NOTE — Transfer of Care (Signed)
Immediate Anesthesia Transfer of Care Note  Patient: Alexander Walter  Procedure(s) Performed: Procedure(s): HERNIA REPAIR INGUINAL ADULT (Right)  Patient Location: PACU  Anesthesia Type:MAC  Level of Consciousness: awake  Airway & Oxygen Therapy: Patient Spontanous Breathing and Patient connected to face mask oxygen  Post-op Assessment: Report given to RN  Post vital signs: Reviewed and stable  Last Vitals:  Vitals:   09/21/16 0609 09/21/16 0833  BP: 120/79 (!) 147/95  Pulse: 69 61  Resp: 12 14  Temp: 36.4 C 36.4 C    Last Pain:  Vitals:   09/21/16 0609  TempSrc: Oral         Complications: No apparent anesthesia complications

## 2016-09-21 NOTE — Anesthesia Preprocedure Evaluation (Signed)
Anesthesia Evaluation  Patient identified by MRN, date of birth, ID band Patient awake    Reviewed: Allergy & Precautions, NPO status , Patient's Chart, lab work & pertinent test results  History of Anesthesia Complications (+) PONV  Airway Mallampati: II       Dental   Pulmonary neg pulmonary ROS, former smoker,           Cardiovascular negative cardio ROS       Neuro/Psych negative neurological ROS     GI/Hepatic negative GI ROS, Neg liver ROS,   Endo/Other  negative endocrine ROS  Renal/GU negative Renal ROS     Musculoskeletal   Abdominal   Peds  Hematology negative hematology ROS (+)   Anesthesia Other Findings   Reproductive/Obstetrics                             Anesthesia Physical Anesthesia Plan  ASA: II  Anesthesia Plan: General   Post-op Pain Management:    Induction: Intravenous  Airway Management Planned: Nasal Cannula  Additional Equipment:   Intra-op Plan:   Post-operative Plan:   Informed Consent: I have reviewed the patients History and Physical, chart, labs and discussed the procedure including the risks, benefits and alternatives for the proposed anesthesia with the patient or authorized representative who has indicated his/her understanding and acceptance.     Plan Discussed with:   Anesthesia Plan Comments:         Anesthesia Quick Evaluation

## 2016-09-21 NOTE — Anesthesia Procedure Notes (Signed)
Performed by: [id number], Seann Genther Oxygen Delivery Method: Simple face mask

## 2016-09-21 NOTE — Care Management (Signed)
Patient had outpatient surgical procedure and experience asystole post op, requiring cpr.  Responded to atropine.  Placed in observation.  Cardiology has seen.  At present, do not anticipate any discharge needs
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
## 2016-09-21 NOTE — Discharge Instructions (Signed)

## 2016-09-22 DIAGNOSIS — K4090 Unilateral inguinal hernia, without obstruction or gangrene, not specified as recurrent: Secondary | ICD-10-CM

## 2016-09-22 DIAGNOSIS — R001 Bradycardia, unspecified: Secondary | ICD-10-CM

## 2016-09-22 DIAGNOSIS — I455 Other specified heart block: Secondary | ICD-10-CM

## 2016-09-22 DIAGNOSIS — I469 Cardiac arrest, cause unspecified: Secondary | ICD-10-CM

## 2016-09-22 LAB — CBC
HCT: 42 % (ref 40.0–52.0)
HEMOGLOBIN: 14.5 g/dL (ref 13.0–18.0)
MCH: 33.2 pg (ref 26.0–34.0)
MCHC: 34.5 g/dL (ref 32.0–36.0)
MCV: 96.2 fL (ref 80.0–100.0)
Platelets: 173 10*3/uL (ref 150–440)
RBC: 4.36 MIL/uL — ABNORMAL LOW (ref 4.40–5.90)
RDW: 12.8 % (ref 11.5–14.5)
WBC: 10.6 10*3/uL (ref 3.8–10.6)

## 2016-09-22 LAB — BASIC METABOLIC PANEL
ANION GAP: 4 — AB (ref 5–15)
BUN: 14 mg/dL (ref 6–20)
CALCIUM: 8.8 mg/dL — AB (ref 8.9–10.3)
CHLORIDE: 110 mmol/L (ref 101–111)
CO2: 25 mmol/L (ref 22–32)
Creatinine, Ser: 0.96 mg/dL (ref 0.61–1.24)
GFR calc Af Amer: >60 mL/min (ref >60)
GFR calc non Af Amer: >60 mL/min (ref >60)
GLUCOSE: 115 mg/dL — AB (ref 65–99)
Potassium: 4 mmol/L (ref 3.5–5.1)
Sodium: 139 mmol/L (ref 135–145)

## 2016-09-22 NOTE — Discharge Summary (Addendum)
[facility] NAME: Alexander Walter    MR#:  [id number]  DATE OF BIRTH:  [birth year]  DATE OF ADMISSION:  09/21/2016 ADMITTING PHYSICIAN: Christene Lye, MD  DATE OF DISCHARGE: 09/22/16  PRIMARY CARE PHYSICIAN: Amy Annamary Rummage, NP    ADMISSION DIAGNOSIS:  right inguinal hernia  DISCHARGE DIAGNOSIS:  Sinus pause/marked braydicardia-resolved S/p day 1 right inguinal hernia repair SECONDARY DIAGNOSIS:   Past Medical History:  Diagnosis Date  . Chicken pox    childhood.  . Complication of anesthesia    after ankle surgery  . Crack cocaine use    Last use approx 8 mths ago(12/2011)  . Dislocated shoulder   . History of kidney stones   . Lumbar vertebral fracture ([place]) 1984  . Measles    childhood.   Marland Kitchen PONV (postoperative nausea and vomiting)     HOSPITAL COURSE:  55 y.o. male with history of prior crack/cocaine abuse, prior complication with anesthesia with postoperative nausea and vomiting who presented to [facility] on 12/4 for planned outpatient hernia repair. In the post-operative setting he had an episode of reported asystole requiring atropine x 1 and chest compressions with ROSC at a marked bradycardic rate with 4.3 second pause.   1. Sinus pause/marked bradycardia: -Likely in the setting of vagal stimulation in the post-operative setting -Currently asymptomatic -Labs and echo without significant abnormality -Avoid AV nodal blocking agents -Plan for outpatient nuclear stress test to evaluate for high-risk ischemia per cardiology recommendation -EKG and tele in SR  2. Possible sleep apnea: -Snores at night -Bradycardic into the upper 40's bpm overnight while sleeping -Plan for outpatient sleep study  3. S/p right inguinal hernia repair doing well D/w dr Jamal Collin  Pt will d/c home today CONSULTS OBTAINED:  Treatment Team:  Wellington Hampshire, MD  DRUG ALLERGIES:  No Known Allergies  DISCHARGE MEDICATIONS:    Current Discharge Medication List    START taking these medications   Details  oxyCODONE-acetaminophen (ROXICET) 5-325 MG tablet Take 1 tablet by mouth every 4 (four) hours as needed. Qty: 20 tablet, Refills: 0        If you experience worsening of your admission symptoms, develop shortness of breath, life threatening emergency, suicidal or homicidal thoughts you must seek medical attention immediately by calling 911 or calling your MD immediately  if symptoms less severe.  You Must read complete instructions/literature along with all the possible adverse reactions/side effects for all the Medicines you take and that have been prescribed to you. Take any new Medicines after you have completely understood and accept all the possible adverse reactions/side effects.   Please note  You were cared for by a hospitalist during your hospital stay. If you have any questions about your discharge medications or the care you received while you were in the hospital after you are discharged, you can call the unit and asked to speak with the hospitalist on call if the hospitalist that took care of you is not available. Once you are discharged, your primary care physician will handle any further medical issues. Please note that NO REFILLS for any discharge medications will be authorized once you are discharged, as it is imperative that you return to your primary care physician (or establish a relationship with a primary care physician if you do not have one) for your aftercare needs so that they can reassess your need for medications and monitor your lab values. Today   SUBJECTIVE  Doing  well no cp or sob  VITAL SIGNS:  Blood pressure 118/74, pulse 65, temperature 98.2 F (36.8 C), temperature source Oral, resp. rate 16, height 5\' 7"  (1.702 m), weight 74.4 kg (164 lb), SpO2 98 %.  I/O:   Intake/Output Summary (Last 24 hours) at 09/22/16 1230 Last data filed at 09/22/16 1049  Gross per 24 hour   Intake              840 ml  Output             1605 ml  Net             -765 ml    PHYSICAL EXAMINATION:  GENERAL:  55 y.o.-year-old patient lying in the bed with no acute distress.  EYES: Pupils equal, round, reactive to light and accommodation. No scleral icterus. Extraocular muscles intact.  HEENT: Head atraumatic, normocephalic. Oropharynx and nasopharynx clear.  NECK:  Supple, no jugular venous distention. No thyroid enlargement, no tenderness.  LUNGS: Normal breath sounds bilaterally, no wheezing, rales,rhonchi or crepitation. No use of accessory muscles of respiration.  CARDIOVASCULAR: S1, S2 normal. No murmurs, rubs, or gallops.  ABDOMEN: Soft, non-tender, non-distended. Bowel sounds present. No organomegaly or mass.  EXTREMITIES: No pedal edema, cyanosis, or clubbing.  NEUROLOGIC: Cranial nerves II through XII are intact. Muscle strength 5/5 in all extremities. Sensation intact. Gait not checked.  PSYCHIATRIC: The patient is alert and oriented x 3.  SKIN: No obvious rash, lesion, or ulcer.   DATA REVIEW:   CBC   Recent Labs Lab 09/22/16 0350  WBC 10.6  HGB 14.5  HCT 42.0  PLT 173    Chemistries   Recent Labs Lab 09/21/16 0931 09/22/16 0350  NA 138 139  K 4.9 4.0  CL 107 110  CO2 27 25  GLUCOSE 111* 115*  BUN 12 14  CREATININE 0.92 0.96  CALCIUM 8.7* 8.8*  MG 2.0  --   AST 21  --   ALT 17  --   ALKPHOS 59  --   BILITOT 0.3  --     Microbiology Results   No results found for this or any previous visit (from the past 240 hour(s)).  RADIOLOGY:  No results found.   Management plans discussed with the patient, family and they are in agreement.  CODE STATUS:     Code Status Orders        Start     Ordered   09/21/16 1141  Full code  Continuous     09/21/16 1140    Code Status History    Date Active Date Inactive Code Status Order ID Comments User Context   This patient has a current code status but no historical code status.    Advance  Directive Documentation   [place] Most Recent Value  Type of Advance Directive  Living will  Pre-existing out of facility DNR order (yellow form or pink MOST form)  No data  "MOST" Form in Place?  No data      TOTAL TIME TAKING CARE OF THIS PATIENT: 40 minutes.    Lynton Crescenzo M.D on 09/22/2016 at 12:30 PM  Between 7am to 6pm - Pager - [phone] After 6pm go to [email or web address] - password [email or web address]  [facility]  Office  [phone]  CC: Primary care physician; Amy Annamary Rummage, NP

## 2016-09-22 NOTE — Progress Notes (Signed)
Patient Name: Alexander Walter Date of Encounter: 09/22/2016  Primary Cardiologist: [facility] Problem List     Principal Problem:   Asystole [facility]) Active Problems:   Bradycardia   Sinus pause   H/O hernia repair     Subjective   No acute issues overnight. Labs and echo without significant abnormality. No dizziness or presyncope. Ready to go home.  Inpatient Medications    Scheduled Meds: . sodium chloride flush  3 mL Intravenous Q12H   Continuous Infusions:  PRN Meds:    Vital Signs    Vitals:   09/21/16 1103 09/21/16 1138 09/21/16 1954 09/22/16 0402  BP: 125/82 118/75 114/64 123/70  Pulse: (!) 53 63 61 66  Resp: 18 16 18 18   Temp: 98.2 F (36.8 C)  98.5 F (36.9 C) 98.4 F (36.9 C)  TempSrc:   Oral Oral  SpO2: 100% 100% 99% 94%  Weight:      Height:        Intake/Output Summary (Last 24 hours) at 09/22/16 0753 Last data filed at 09/22/16 [id number]  Gross per 24 hour  Intake             1360 ml  Output             1605 ml  Net             -245 ml   Filed Weights   09/21/16 0609  Weight: 164 lb (74.4 kg)    Physical Exam    GEN: Well nourished, well developed, in no acute distress.  HEENT: Grossly normal.  Neck: Supple, no JVD, carotid bruits, or masses. Cardiac: RRR, no murmurs, rubs, or gallops. No clubbing, cyanosis, edema.  Radials/DP/PT 2+ and equal bilaterally.  Respiratory:  Respirations regular and unlabored, clear to auscultation bilaterally. GI: Soft, nontender, nondistended, BS + x 4. MS: no deformity or atrophy. Skin: warm and dry, no rash. Neuro:  Strength and sensation are intact. Psych: AAOx3.  Normal affect.  Labs    CBC  Recent Labs  09/21/16 0931 09/22/16 0350  WBC 5.3 10.6  HGB 14.3 14.5  HCT 41.7 42.0  MCV 95.6 96.2  PLT 166 [id number]   Basic Metabolic Panel  Recent Labs  09/21/16 0931 09/22/16 0350  NA 138 139  K 4.9 4.0  CL 107 110  CO2 27 25  GLUCOSE 111* 115*  BUN 12 14  CREATININE 0.92 0.96    CALCIUM 8.7* 8.8*  MG 2.0  --    Liver Function Tests  Recent Labs  09/21/16 0931  AST 21  ALT 17  ALKPHOS 59  BILITOT 0.3  PROT 6.4*  ALBUMIN 3.8   No results for input(s): LIPASE, AMYLASE in the last 72 hours. Cardiac Enzymes  Recent Labs  09/21/16 1214 09/21/16 1641 09/21/16 2034  TROPONINI <0.03 <0.03 <0.03   BNP Invalid input(s): POCBNP D-Dimer No results for input(s): DDIMER in the last 72 hours. Hemoglobin A1C No results for input(s): HGBA1C in the last 72 hours. Fasting Lipid Panel No results for input(s): CHOL, HDL, LDLCALC, TRIG, CHOLHDL, LDLDIRECT in the last 72 hours. Thyroid Function Tests  Recent Labs  09/21/16 0931  TSH 2.787    Telemetry    NSR, 70's bpm currently, overnight sinus bradycardia into the upper 40's bpm. No significant pauses - Personally Reviewed  ECG    n/a - Personally Reviewed  Radiology    No results found.  Cardiac Studies   Echo 09/21/16: Study Conclusions  -  Left ventricle: The cavity size was normal. Wall thickness was   normal. Systolic function was normal. The estimated ejection   fraction was in the range of 55% to 60%. Wall motion was normal;   there were no regional wall motion abnormalities. - Mitral valve: There was mild regurgitation. - Right atrium: The atrium was mildly dilated. - Tricuspid valve: There was mild regurgitation. - Pulmonary arteries: Systolic pressure was within the normal   range.  Patient Profile     55 y.o. male with history of prior crack/cocaine abuse, prior complication with anesthesia with postoperative nausea and vomiting who presented to [facility] on 12/4 for planned outpatient hernia repair. In the post-operative setting he had an episode of reported asystole requiring atropine x 1 and chest compressions with ROSC at a marked bradycardic rate with 4.3 second pause.   Assessment & Plan    1. Sinus pause/marked bradycardia: -Likely in the setting of vagal stimulation in the  post-operative setting -Currently asymptomatic -Labs and echo without significant abnormality -Avoid AV nodal blocking agents -Plan for outpatient nuclear stress test to evaluate for high-risk ischemia   2. Possible sleep apnea: -Snores at night -Bradycardic into the upper 40's bpm overnight while sleeping -Plan for outpatient sleep study  Signed, Marcille Blanco [place] Pager: [phone] 09/22/2016, 7:53 AM   Attending Note Patient seen and examined, agree with detailed note above,  Patient presentation and plan discussed on rounds.   No significant arrhythmia overnight Patient reports that he feels well, no complaints Some mild abdominal tenderness with movement Events from yesterday reviewed with him in detail Felt secondary to vagal stimulation  Vitals stable,  low heart rate overnight while sleeping Lungs clear, heart sounds regular on exam, abdomen soft nontender, no significant leg edema Other physical exam findings as above  In brief, echocardiogram showing normal ejection fraction, no other cardiac pathology Vagal stimulation likely causing marked bradycardia, sinus pause Outpatient stress test will be scheduled  Greater than 50% was spent in counseling and coordination of care with patient Total encounter time 25 minutes or more   Signed: Esmond Plants  M.D., Ph.D. [facility]

## 2016-09-22 NOTE — Plan of Care (Signed)
Problem: Physical Regulation: Goal: Postoperative complications will be avoided or minimized Outcome: Progressing Patient is running NSR to SB on the monitor. No complaints of pain.

## 2016-09-22 NOTE — Progress Notes (Signed)
IV and tele removed from patient. Discharge instructions given to patient along with hard copy prescription.  Patient verbalized understanding. No distress at this time. Wife is at bedside and will be transporting patient home.

## 2016-09-24 ENCOUNTER — Encounter: Payer: Self-pay | Admitting: *Deleted

## 2016-09-28 ENCOUNTER — Encounter: Payer: Self-pay | Admitting: General Surgery

## 2016-09-28 ENCOUNTER — Ambulatory Visit (INDEPENDENT_AMBULATORY_CARE_PROVIDER_SITE_OTHER): Payer: Self-pay | Admitting: General Surgery

## 2016-09-28 VITALS — BP 122/76 | HR 68 | Resp 12 | Ht 67.0 in | Wt 166.0 lb

## 2016-09-28 DIAGNOSIS — K4090 Unilateral inguinal hernia, without obstruction or gangrene, not specified as recurrent: Secondary | ICD-10-CM
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
## 2016-09-28 NOTE — Patient Instructions (Addendum)
  Return in five weeks. Next week can start some activity.

## 2016-10-21 ENCOUNTER — Encounter: Payer: Self-pay | Admitting: General Surgery

## 2016-11-02 ENCOUNTER — Ambulatory Visit: Payer: Self-pay | Admitting: General Surgery

## 2016-11-10 ENCOUNTER — Encounter: Payer: Self-pay | Admitting: General Surgery

## 2016-11-10 ENCOUNTER — Ambulatory Visit (INDEPENDENT_AMBULATORY_CARE_PROVIDER_SITE_OTHER): Payer: BC Managed Care – PPO | Admitting: General Surgery

## 2016-11-10 VITALS — BP 100/62 | HR 58 | Resp 12 | Ht 69.0 in | Wt 171.0 lb

## 2016-11-10 DIAGNOSIS — K4090 Unilateral inguinal hernia, without obstruction or gangrene, not specified as recurrent: Secondary | ICD-10-CM
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
## 2016-11-10 NOTE — Patient Instructions (Signed)
Return as needed

## 2016-12-10 ENCOUNTER — Telehealth: Payer: Self-pay | Admitting: *Deleted

## 2016-12-10 NOTE — Telephone Encounter (Signed)
Patient called and had surgery back in December 2017 and he got approved for medicaid for the month of December. He wants [place] to file medicaid for his office visits and surgery. Patient is aware that you are out of the office and will be back next week

## 2017-11-13 ENCOUNTER — Emergency Department: Payer: BC Managed Care – PPO

## 2017-11-13 ENCOUNTER — Emergency Department
Admission: EM | Admit: 2017-11-13 | Discharge: 2017-11-13 | Disposition: A | Discharge status: Home / Self Care | Payer: BC Managed Care – PPO | Attending: Emergency Medicine | Admitting: Emergency Medicine

## 2017-11-13 DIAGNOSIS — Z87891 Personal history of nicotine dependence: Secondary | ICD-10-CM | POA: Diagnosis not present

## 2017-11-13 DIAGNOSIS — N200 Calculus of kidney: Secondary | ICD-10-CM | POA: Diagnosis not present

## 2017-11-13 DIAGNOSIS — R1031 Right lower quadrant pain: Secondary | ICD-10-CM | POA: Diagnosis present

## 2017-11-13 MED ORDER — IBUPROFEN 600 MG PO TABS: 600.0000 mg | ORAL_TABLET | Freq: Three times a day (TID) | ORAL | 0 refills | Status: DC | PRN

## 2017-11-13 MED ORDER — ONDANSETRON 4 MG PO TBDP
4.0000 mg | ORAL_TABLET | Freq: Once | ORAL | Status: AC
  Administered 2017-11-13: 4 mg via ORAL
  Filled 2017-11-13: qty 1

## 2017-11-13 MED ORDER — KETOROLAC TROMETHAMINE 30 MG/ML IJ SOLN
30.0000 mg | Freq: Once | INTRAMUSCULAR | Status: AC
  Administered 2017-11-13: 30 mg via INTRAMUSCULAR
  Filled 2017-11-13: qty 1

## 2017-11-13 MED ORDER — HYDROCODONE-ACETAMINOPHEN 5-325 MG PO TABS
2.0000 | ORAL_TABLET | Freq: Once | ORAL | Status: AC
  Administered 2017-11-13: 2 via ORAL
  Filled 2017-11-13: qty 2

## 2017-11-13 MED ORDER — HYDROCODONE-ACETAMINOPHEN 5-325 MG PO TABS: 1.0000 | ORAL_TABLET | Freq: Four times a day (QID) | ORAL | 0 refills | Status: DC | PRN

## 2017-11-13 NOTE — ED Triage Notes (Signed)
Pt arrived to ED via POV with complaints of right front lower abdominal pain. Pt states pain woke him from sleep around 1230am. Unable to sit still at this time and appears pale. Pt states he is currently nauseous and pacing in room while being triaged

## 2017-11-13 NOTE — ED Notes (Signed)
Patient transported to CT 

## 2017-11-13 NOTE — Discharge Instructions (Addendum)
The most dangerous thing that could possibly happen with a kidney stone is you could develop an infection behind the kidney stone which could be life-threatening.  If you develop a fever, chills, or begins sweating please come back to the emergency department immediately.  Otherwise follow-up with urology as needed.  Return to the emergency department for any concerns.  It was a pleasure to take care of you today, and thank you for coming to our emergency department.  If you have any questions or concerns before leaving please ask the nurse to grab me and I'm more than happy to go through your aftercare instructions again.  If you were prescribed any opioid pain medication today such as Norco, Vicodin, Percocet, morphine, hydrocodone, or oxycodone please make sure you do not drive when you are taking this medication as it can alter your ability to drive safely.  If you have any concerns once you are home that you are not improving or are in fact getting worse before you can make it to your follow-up appointment, please do not hesitate to call 911 and come back for further evaluation.  Darel Hong, MD  Results for orders placed or performed during the hospital encounter of 09/21/16  Urine Drug Screen, Qualitative ([facility] only)  Result Value Ref Range   Tricyclic, Ur Screen NONE DETECTED NONE DETECTED   Amphetamines, Ur Screen NONE DETECTED NONE DETECTED   MDMA (Ecstasy)Ur Screen NONE DETECTED NONE DETECTED   Cocaine Metabolite,Ur [place] NONE DETECTED NONE DETECTED   Opiate, Ur Screen NONE DETECTED NONE DETECTED   Phencyclidine (PCP) Ur S NONE DETECTED NONE DETECTED   Cannabinoid 50 Ng, Ur [place] NONE DETECTED NONE DETECTED   Barbiturates, Ur Screen NONE DETECTED NONE DETECTED   Benzodiazepine, Ur Scrn NONE DETECTED NONE DETECTED   Methadone Scn, Ur NONE DETECTED NONE DETECTED  Comprehensive metabolic panel  Result Value Ref Range   Sodium 138 135 - 145 mmol/L   Potassium 4.9 3.5 - 5.1 mmol/L   Chloride 107 101 - 111 mmol/L   CO2 27 22 - 32 mmol/L   Glucose, Bld 111 (H) 65 - 99 mg/dL   BUN 12 6 - 20 mg/dL   Creatinine, Ser 0.92 0.61 - 1.24 mg/dL   Calcium 8.7 (L) 8.9 - 10.3 mg/dL   Total Protein 6.4 (L) 6.5 - 8.1 g/dL   Albumin 3.8 3.5 - 5.0 g/dL   AST 21 15 - 41 U/L   ALT 17 17 - 63 U/L   Alkaline Phosphatase 59 38 - 126 U/L   Total Bilirubin 0.3 0.3 - 1.2 mg/dL   GFR calc non Af Amer >60 >60 mL/min   GFR calc Af Amer >60 >60 mL/min   Anion gap 4 (L) 5 - 15  CBC  Result Value Ref Range   WBC 5.3 3.8 - 10.6 K/uL   RBC 4.36 (L) 4.40 - 5.90 MIL/uL   Hemoglobin 14.3 13.0 - 18.0 g/dL   HCT 41.7 40.0 - 52.0 %   MCV 95.6 80.0 - 100.0 fL   MCH 32.8 26.0 - 34.0 pg   MCHC 34.3 32.0 - 36.0 g/dL   RDW 12.7 11.5 - 14.5 %   Platelets 166 150 - 440 K/uL  TSH  Result Value Ref Range   TSH 2.787 0.350 - 4.500 uIU/mL  Magnesium  Result Value Ref Range   Magnesium 2.0 1.7 - 2.4 mg/dL  Troponin I  Result Value Ref Range   Troponin I <0.03 <0.03 ng/mL  Troponin I  Result Value Ref Range   Troponin I <0.03 <0.03 ng/mL  Troponin I  Result Value Ref Range   Troponin I <0.03 <0.03 ng/mL  Basic metabolic panel  Result Value Ref Range   Sodium 139 135 - 145 mmol/L   Potassium 4.0 3.5 - 5.1 mmol/L   Chloride 110 101 - 111 mmol/L   CO2 25 22 - 32 mmol/L   Glucose, Bld 115 (H) 65 - 99 mg/dL   BUN 14 6 - 20 mg/dL   Creatinine, Ser 0.96 0.61 - 1.24 mg/dL   Calcium 8.8 (L) 8.9 - 10.3 mg/dL   GFR calc non Af Amer >60 >60 mL/min   GFR calc Af Amer >60 >60 mL/min   Anion gap 4 (L) 5 - 15  CBC  Result Value Ref Range   WBC 10.6 3.8 - 10.6 K/uL   RBC 4.36 (L) 4.40 - 5.90 MIL/uL   Hemoglobin 14.5 13.0 - 18.0 g/dL   HCT 42.0 40.0 - 52.0 %   MCV 96.2 80.0 - 100.0 fL   MCH 33.2 26.0 - 34.0 pg   MCHC 34.5 32.0 - 36.0 g/dL   RDW 12.8 11.5 - 14.5 %   Platelets 173 150 - 440 K/uL  ECHOCARDIOGRAM COMPLETE  Result Value Ref Range   Weight 2,624 oz   Height 67 in   BP 130/85 mmHg    Ct Renal Stone Study  Result Date: 11/13/2017 CLINICAL DATA:  Right lower abdominal pain since about 12:30 a.m. Nausea. History of kidney stones. EXAM: CT ABDOMEN AND PELVIS WITHOUT CONTRAST TECHNIQUE: Multidetector CT imaging of the abdomen and pelvis was performed following the standard protocol without IV contrast. COMPARISON:  05/11/2008 FINDINGS: Lower chest: The lung bases are clear. Hepatobiliary: Small stone in the gallbladder. No gallbladder wall thickening or edema. No bile duct dilatation. No focal liver lesions. Pancreas: Unremarkable. No pancreatic ductal dilatation or surrounding inflammatory changes. Spleen: Normal in size without focal abnormality. Adrenals/Urinary Tract: No adrenal gland nodules. 4 mm stone in the distal right ureter at the ureterovesical junction level. Proximal hydronephrosis and hydroureter. Stranding around the right kidney and ureter. Left kidney, left ureter, and the bladder are unremarkable. Stomach/Bowel: Stomach is within normal limits. Appendix appears normal. No evidence of bowel wall thickening, distention, or inflammatory changes. Vascular/Lymphatic: Aortic atherosclerosis. No enlarged abdominal or pelvic lymph nodes. Reproductive: Prostate gland is enlarged, measuring 4.8 cm diameter. Other: No abdominal wall hernia or abnormality. No abdominopelvic ascites. Musculoskeletal: Subcutaneous emphysema over the right gluteal region likely representing an injection site. Degenerative changes in the spine. Anterior compression of L1 with degenerative changes, appears chronic. IMPRESSION: 1. 4 mm stone in the distal right ureter with moderate proximal obstruction. 2. Cholelithiasis without evidence of cholecystitis. 3. Aortic atherosclerosis. 4. Enlarged prostate gland. 5. Chronic appearing compression of L1. Electronically Signed   By: Lucienne Capers M.D.   On: 11/13/2017 03:55
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
## 2017-11-13 NOTE — ED Notes (Addendum)
Pt states he feels like he needs to urinate but is unable to at this time. States he was only able to pass a small amount of urine around 0030. On inspection abdomen does not appear to be distended of firm with palpation. Unable to not any bladder distention. Pt denies any increase in pain with palpation. Unable to obtain a full set of vitals during triage due to actively vomiting and pacing.

## 2018-08-14 IMAGING — CT CT RENAL STONE PROTOCOL
2 of 4 series · 16 of 46 positions shown, 18 images · non-contrast
Comparison: 05/11/2008

CLINICAL DATA: Right lower abdominal pain since about [DATE] a.m..
Nausea. History of kidney stones.

EXAM:
CT ABDOMEN AND PELVIS WITHOUT CONTRAST
TECHNIQUE: Multidetector CT imaging of the abdomen and pelvis was performed
following the standard protocol without IV contrast.

[Series 2: stone full standard · axial · 0.74mm/px · z∈[-1007,-602]mm · 13 of 89 slices shown, 15 images]
[im 4/89  soft-tissue]
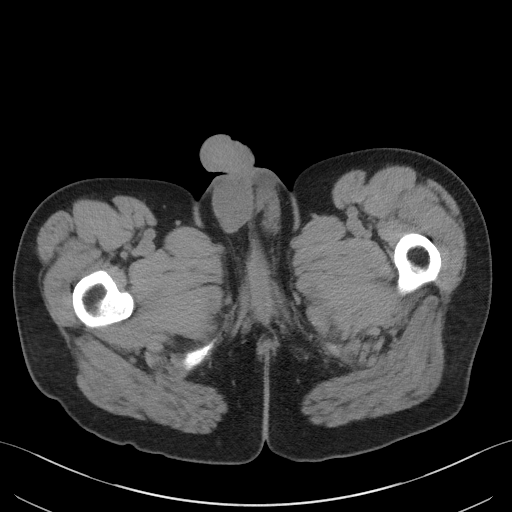
[im 4/89  bone]
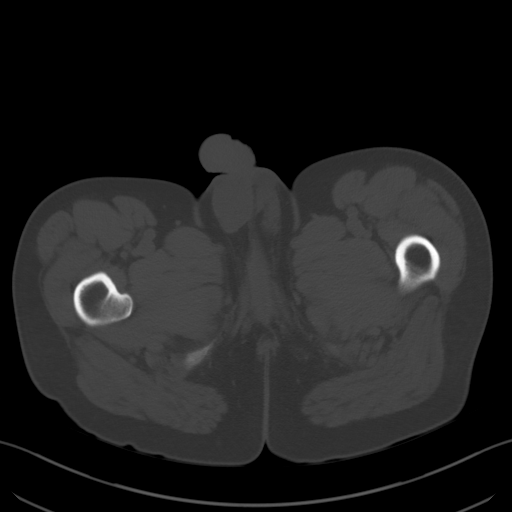
[im 12/89  soft-tissue]
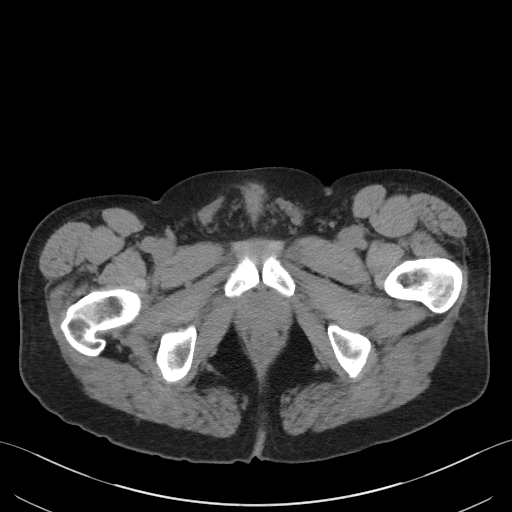
[im 19/89  soft-tissue]
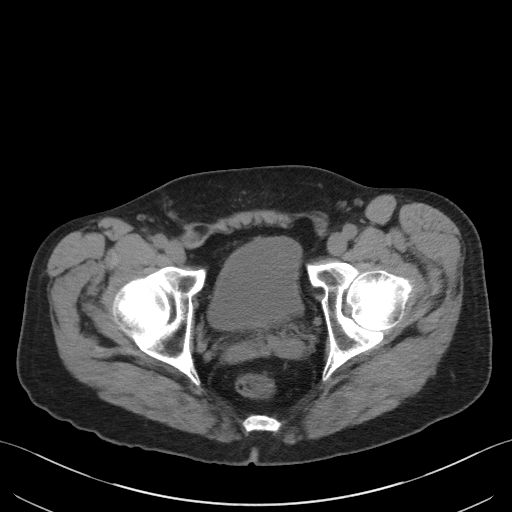
[im 26/89  soft-tissue]
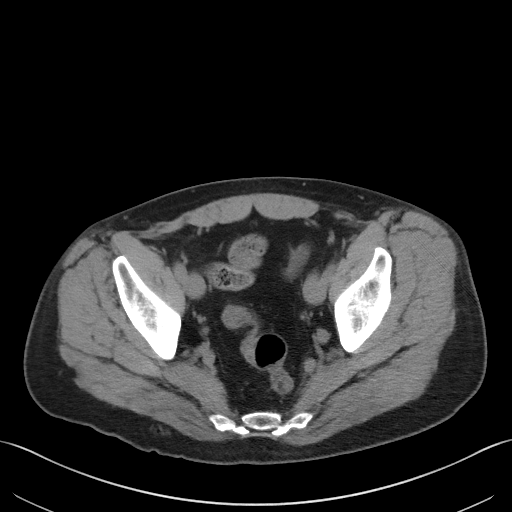
[im 30/89  soft-tissue]
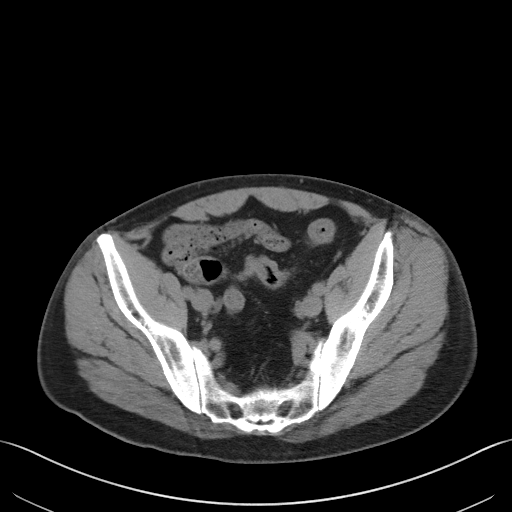
[im 37/89  soft-tissue]
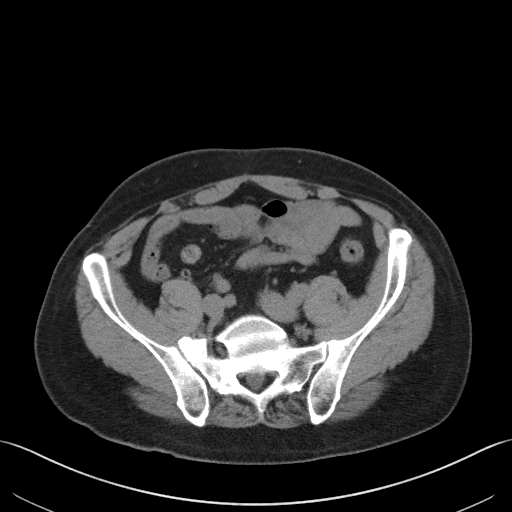
[im 45/89  soft-tissue]
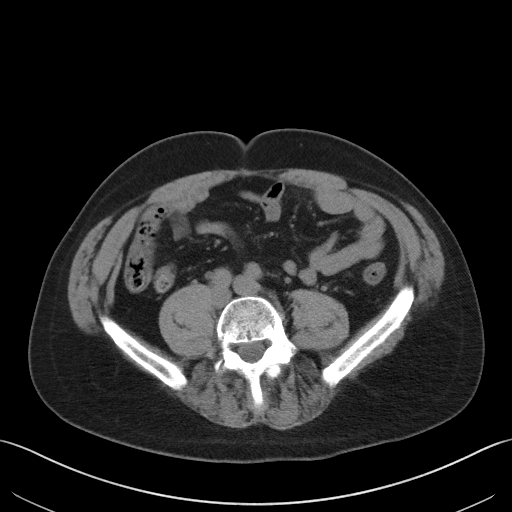
[im 52/89  soft-tissue]
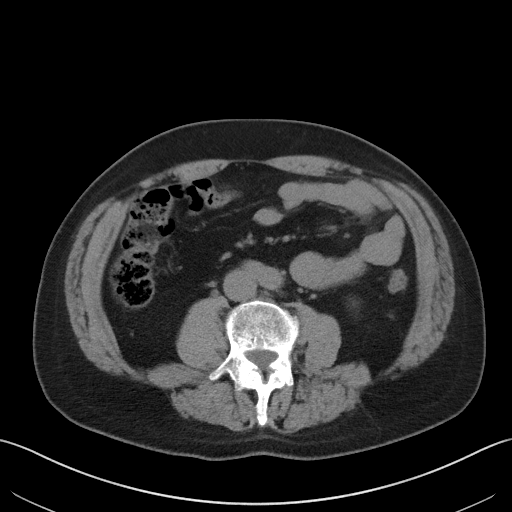
[im 59/89  soft-tissue]
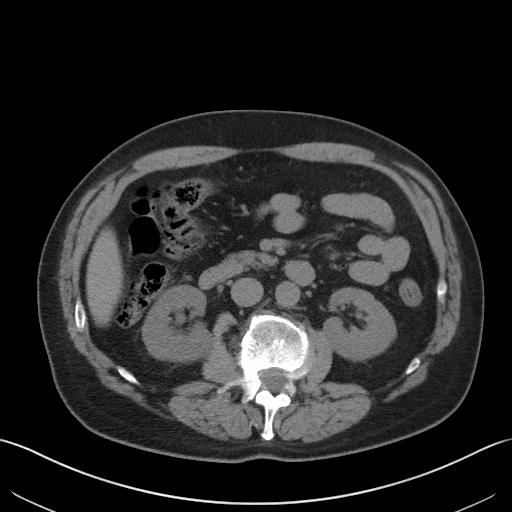
[im 59/89  bone]
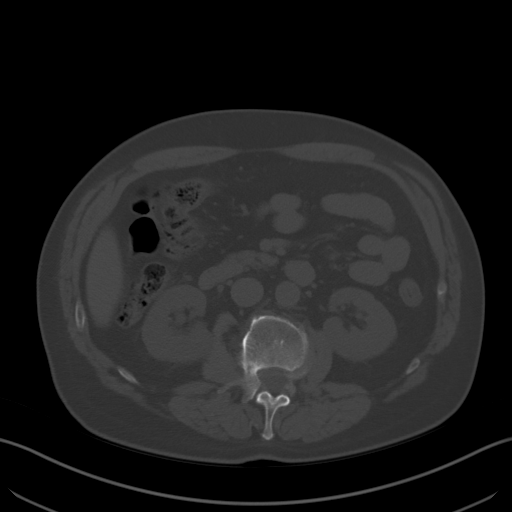
[im 63/89  soft-tissue]
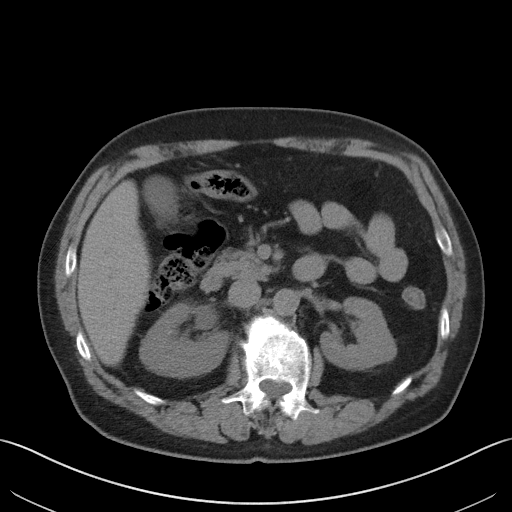
[im 70/89  soft-tissue]
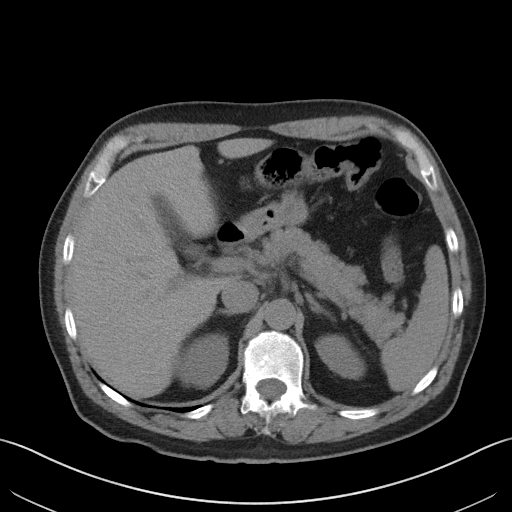
[im 78/89  soft-tissue]
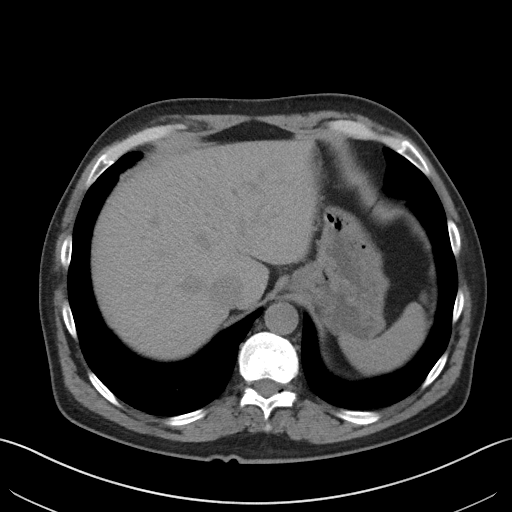
[im 85/89  soft-tissue]
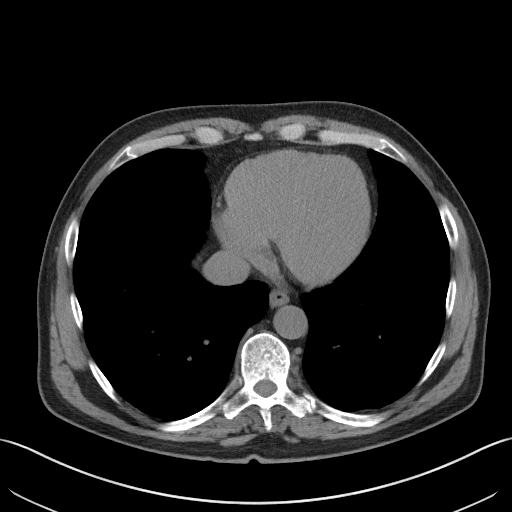

[Series 5: coronal · coronal · 0.70mm/px · 3 of 128 slices shown]
[im 43/128  soft-tissue]
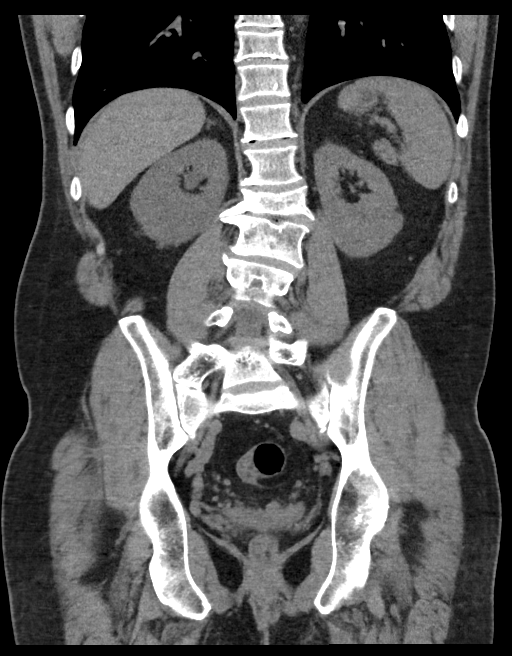
[im 57/128  soft-tissue]
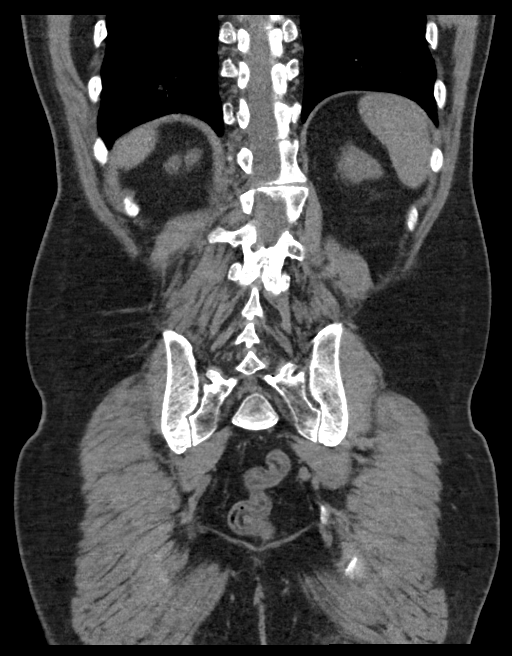
[im 71/128  soft-tissue]
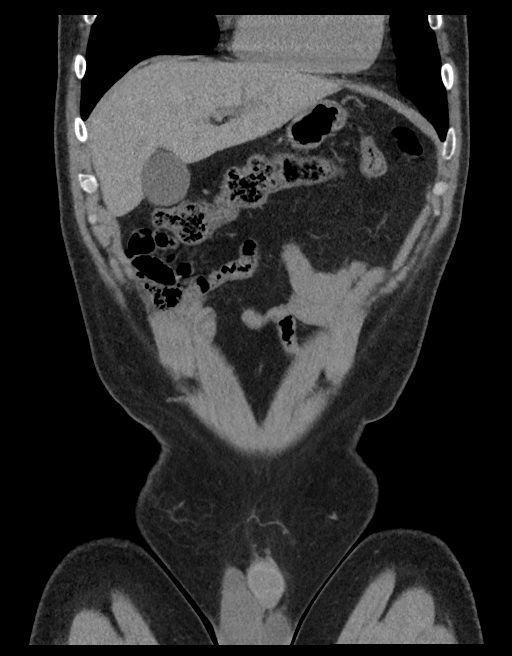

[16 of 46 positions shown; findings below may reference images not displayed]

FINDINGS: Lower chest: The lung bases are clear.

Hepatobiliary: Small stone in the gallbladder. No gallbladder wall
thickening or edema. No bile duct dilatation. No focal liver
lesions.

Pancreas: Unremarkable. No pancreatic ductal dilatation or
surrounding inflammatory changes.

Spleen: Normal in size without focal abnormality.

Adrenals/Urinary Tract: No adrenal gland nodules. 4 mm stone in the
distal right ureter at the ureterovesical junction level. Proximal
hydronephrosis and hydroureter. Stranding around the right kidney
and ureter. Left kidney, left ureter, and the bladder are
unremarkable.

Stomach/Bowel: Stomach is within normal limits. Appendix appears
normal. No evidence of bowel wall thickening, distention, or
inflammatory changes.

Vascular/Lymphatic: Aortic atherosclerosis. No enlarged abdominal or
pelvic lymph nodes.

Reproductive: Prostate gland is enlarged, measuring 4.8 cm diameter.

Other: No abdominal wall hernia or abnormality. No abdominopelvic
ascites.

Musculoskeletal: Subcutaneous emphysema over the right gluteal
region likely representing an injection site. Degenerative changes
in the spine. Anterior compression of L1 with degenerative changes,
appears chronic.
IMPRESSION: 1. 4 mm stone in the distal right ureter with moderate proximal
obstruction.
2. Cholelithiasis without evidence of cholecystitis.
3. Aortic atherosclerosis.
4. Enlarged prostate gland.
5. Chronic appearing compression of L1.

## 2020-11-26 ENCOUNTER — Encounter: Payer: Self-pay | Admitting: Family Medicine

## 2020-11-26 ENCOUNTER — Other Ambulatory Visit: Payer: Self-pay

## 2020-11-26 ENCOUNTER — Ambulatory Visit: Payer: Self-pay

## 2020-11-26 ENCOUNTER — Ambulatory Visit: Payer: Self-pay | Admitting: Family Medicine

## 2020-11-26 DIAGNOSIS — Z113 Encounter for screening for infections with a predominantly sexual mode of transmission: Secondary | ICD-10-CM

## 2020-11-26 LAB — GRAM STAIN

## 2020-11-26 LAB — HIV ANTIBODY (ROUTINE TESTING W REFLEX): HIV 1&2 Ab, 4th Generation: NONREACTIVE

## 2020-11-26 LAB — HM HEPATITIS C SCREENING LAB: HM Hepatitis Screen: NEGATIVE

## 2020-11-26 LAB — HEPATITIS B SURFACE ANTIGEN

## 2020-11-26 NOTE — Progress Notes (Signed)
   [facility] Department STI clinic/screening visit  Subjective:  Alexander Walter is a 60 y.o. male being seen today for an STI screening visit. The patient reports they do not have symptoms.    Patient has the following medical conditions:   Patient Active Problem List   Diagnosis Date Noted  . Right inguinal hernia   . Bradycardia 09/21/2016  . Sinus pause 09/21/2016  . H/O hernia repair 09/21/2016  . Asystole ([place]) 09/21/2016  . Preoperative cardiovascular examination 04/08/2016  . Smoker 09/11/2015  . GERD (gastroesophageal reflux disease) 09/11/2015  . Shortness of breath 09/11/2015  . Routine general medical examination at a health care facility 02/24/2013  . Substance abuse in remission [facility]) 01/08/2012     Chief Complaint  Patient presents with  . STD Screening    HPI  Patient reports here for screening, for rehab program purposes.  Denies any s/sx    See flowsheet for further details and programmatic requirements.    The following portions of the patient's history were reviewed and updated as appropriate: allergies, current medications, past medical history, past social history, past surgical history and problem list.  Objective:  There were no vitals filed for this visit.  Physical Exam Constitutional:      Appearance: Normal appearance.  HENT:     Head: Normocephalic.     Mouth/Throat:     Mouth: Mucous membranes are moist.     Pharynx: Oropharynx is clear. No oropharyngeal exudate.  Genitourinary:    Comments: No lice, nits, or pest, no lesions or odor discharge.  Denies pain or tenderness with paplation of testicles.  No lesions, ulcers or masses present.    Musculoskeletal:     Cervical back: Normal range of motion.  Lymphadenopathy:     Cervical: No cervical adenopathy.  Skin:    General: Skin is warm and dry.     Findings: No bruising, erythema, lesion or rash.  Neurological:     Mental Status: He is alert and oriented to  person, place, and time.  Psychiatric:        Mood and Affect: Mood normal.        Behavior: Behavior normal.       Assessment and Plan:  Alexander Walter is a 60 y.o. male presenting to the [facility] Department for STI screening  1. Screening examination for venereal disease - Gonococcus culture - Gram stain - HBV Antigen/Antibody State Lab - HIV/HCV [place] [facility] - Syphilis Serology, [place] [facility]  Patient does not have STI symptoms Patient accepted all screenings including  Gram stain, urethral GC  and bloodwork for HIV/RPR/HCV/HBV Patient meets criteria for HepB screening? Yes. Ordered? Yes Patient meets criteria for HepC screening? Yes. Ordered? Yes Recommended condom use with all sex Discussed importance of condom use for STI prevent  Treat gram stain per standing order Discussed time line for State Lab results and that patient will be called with positive results and encouraged patient to call if he had not heard in 2 weeks Recommended returning for continued or worsening symptoms.     No follow-ups on file.  No future appointments.  Junious Dresser, FNP

## 2020-11-26 NOTE — Progress Notes (Signed)
Here today for STD screening. Accepts bloodwork. Demarko Zeimet, RN ° °

## 2020-12-01 LAB — GONOCOCCUS CULTURE

## 2020-12-02 ENCOUNTER — Other Ambulatory Visit: Payer: Self-pay

## 2020-12-02 NOTE — Progress Notes (Signed)
RN abstracted Hep B non reactive. Deri Fuelling, RN

## 2020-12-02 NOTE — Progress Notes (Signed)
RN abstracted HIV/HepC non reactive. Deri Fuelling, RN

## 2024-01-10 ENCOUNTER — Other Ambulatory Visit: Payer: Self-pay

## 2024-01-10 ENCOUNTER — Ambulatory Visit: Payer: Medicaid Other | Admitting: Family Medicine

## 2024-01-10 ENCOUNTER — Encounter: Payer: Self-pay | Admitting: Family Medicine

## 2024-01-10 VITALS — BP 132/77 | HR 66 | Ht 67.0 in | Wt 178.0 lb

## 2024-01-10 DIAGNOSIS — L989 Disorder of the skin and subcutaneous tissue, unspecified: Secondary | ICD-10-CM

## 2024-01-10 DIAGNOSIS — Z7689 Persons encountering health services in other specified circumstances: Secondary | ICD-10-CM | POA: Insufficient documentation

## 2024-01-10 DIAGNOSIS — R1312 Dysphagia, oropharyngeal phase: Secondary | ICD-10-CM

## 2024-01-10 DIAGNOSIS — Z8719 Personal history of other diseases of the digestive system: Secondary | ICD-10-CM

## 2024-01-10 DIAGNOSIS — Z9889 Other specified postprocedural states: Secondary | ICD-10-CM

## 2024-01-10 DIAGNOSIS — Z13220 Encounter for screening for lipoid disorders: Secondary | ICD-10-CM

## 2024-01-10 DIAGNOSIS — F1911 Other psychoactive substance abuse, in remission: Secondary | ICD-10-CM

## 2024-01-10 DIAGNOSIS — Z1329 Encounter for screening for other suspected endocrine disorder: Secondary | ICD-10-CM

## 2024-01-10 DIAGNOSIS — Z114 Encounter for screening for human immunodeficiency virus [HIV]: Secondary | ICD-10-CM

## 2024-01-10 DIAGNOSIS — Z130 Encounter for screening for diseases of the blood and blood-forming organs and certain disorders involving the immune mechanism: Secondary | ICD-10-CM

## 2024-01-10 DIAGNOSIS — Z23 Encounter for immunization: Secondary | ICD-10-CM

## 2024-01-10 DIAGNOSIS — Z1159 Encounter for screening for other viral diseases: Secondary | ICD-10-CM

## 2024-01-10 DIAGNOSIS — Z833 Family history of diabetes mellitus: Secondary | ICD-10-CM

## 2024-01-10 MED ORDER — TETANUS-DIPHTH-ACELL PERTUSSIS 5-2.5-18.5 LF-MCG/0.5 IM SUSP: 0.5000 mL | Freq: Once | INTRAMUSCULAR | 0 refills | Status: AC

## 2024-01-10 NOTE — Assessment & Plan Note (Addendum)
 Experienced cardiac arrest during hernia surgery with subsequent ROSC.  Right inguinal hernia with mild pain on heavy lifting, prefers to avoid surgery unless symptoms worsen. - Monitor symptoms, consider surgical consultation if symptoms increase.

## 2024-01-10 NOTE — Assessment & Plan Note (Addendum)
 Recent relapse, managing sobriety, facing social challenges (separation; lack of social interactions).  Was in rehab for 13 months; has been out now for 9 months. - Encourage avoidance of triggering social situations. - Support ongoing sobriety efforts.

## 2024-01-10 NOTE — Assessment & Plan Note (Signed)
 Chronic dysphagia with certain meats, possible esophageal stricture or motility disorder. - Refer to gastroenterology for evaluation.

## 2024-01-10 NOTE — Assessment & Plan Note (Signed)
 Declined pneumonia, shingles, flu, and COVID vaccines. Considering tetanus vaccine. - Encourage tetanus vaccination at pharmacy. - Discuss benefits of pneumonia and shingles vaccines. - Schedule Medicare annual wellness visit in 3-6 months.

## 2024-01-10 NOTE — Assessment & Plan Note (Signed)
 Chronic lesion on right nose, pruritic and occasionally bleeding. Differential includes actinic keratosis or basal cell carcinoma. - Refer to dermatology for evaluation.
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
## 2024-01-10 NOTE — Patient Instructions (Signed)
Get Tdap (tetanus) vaccine at pharmacy

## 2024-01-11 LAB — COMPREHENSIVE METABOLIC PANEL
ALT: 17 IU/L (ref 0–44)
AST: 18 IU/L (ref 0–40)
Albumin: 4.4 g/dL (ref 3.9–4.9)
Alkaline Phosphatase: 78 IU/L (ref 44–121)
BUN/Creatinine Ratio: 14 (ref 10–24)
BUN: 14 mg/dL (ref 8–27)
Bilirubin Total: 0.4 mg/dL (ref 0.0–1.2)
CO2: 21 mmol/L (ref 20–29)
Calcium: 9.4 mg/dL (ref 8.6–10.2)
Chloride: 102 mmol/L (ref 96–106)
Creatinine, Ser: 0.99 mg/dL (ref 0.76–1.27)
Globulin, Total: 2.6 g/dL (ref 1.5–4.5)
Glucose: 92 mg/dL (ref 70–99)
Potassium: 4.3 mmol/L (ref 3.5–5.2)
Sodium: 139 mmol/L (ref 134–144)
Total Protein: 7 g/dL (ref 6.0–8.5)
eGFR: 85 mL/min/{1.73_m2} (ref >59)

## 2024-01-11 LAB — HEMOGLOBIN A1C
Est. average glucose Bld gHb Est-mCnc: 114 mg/dL
Hgb A1c MFr Bld: 5.6 % (ref 4.8–5.6)

## 2024-01-11 LAB — LIPID PANEL
Chol/HDL Ratio: 4.7 ratio (ref 0.0–5.0)
Cholesterol, Total: 203 mg/dL — ABNORMAL HIGH (ref 100–199)
HDL: 43 mg/dL (ref >39)
LDL Chol Calc (NIH): 124 mg/dL — ABNORMAL HIGH (ref 0–99)
Triglycerides: 203 mg/dL — ABNORMAL HIGH (ref 0–149)
VLDL Cholesterol Cal: 36 mg/dL (ref 5–40)

## 2024-01-11 LAB — HCV INTERPRETATION

## 2024-01-11 LAB — HIV ANTIBODY (ROUTINE TESTING W REFLEX): HIV Screen 4th Generation wRfx: NONREACTIVE

## 2024-01-11 LAB — HCV AB W REFLEX TO QUANT PCR: HCV Ab: NONREACTIVE

## 2024-01-24 ENCOUNTER — Encounter: Payer: Self-pay | Admitting: Family Medicine

## 2024-01-24 ENCOUNTER — Other Ambulatory Visit: Payer: Self-pay | Admitting: Family Medicine

## 2024-01-24 DIAGNOSIS — E782 Mixed hyperlipidemia: Secondary | ICD-10-CM

## 2024-01-24 MED ORDER — ROSUVASTATIN CALCIUM 10 MG PO TABS: 10.0000 mg | ORAL_TABLET | Freq: Every day | ORAL | 3 refills | Status: DC

## 2024-01-26 ENCOUNTER — Ambulatory Visit: Payer: Self-pay | Admitting: Family Medicine

## 2024-01-26 NOTE — Telephone Encounter (Signed)
 Chief Complaint: Blood pressure elevated  Symptoms: Lightheaded, "sluggish" (fatigue) started today Pertinent Negatives: Patient denies blurred vision, chest pain, difficulty breathing, weakness, numbness  Disposition:  [x] Appointment(In office/virtual)/ [x] Refused Recommended Disposition   Additional Notes: Pt declined scheduling an appointment at this time and states he is going back to [facility] to check his blood pressure again. This RN educated pt on home care, new-worsening symptoms, when to call back/seek emergent care. Pt verbalized understanding and agrees to plan.    Copied from CRM [id number]. Topic: Clinical - Red Word Triage >> Jan 26, 2024  4:13 PM Shelah Lewandowsky wrote: Red Word that prompted transfer to Nurse Triage: lightheaded, dizziness, blood pressure 150/97 Reason for Disposition  [1] Systolic BP  >= 130 OR Diastolic >= 80 AND [2] taking BP medications  Answer Assessment - Initial Assessment Questions BLOOD PRESSURE: "What is the blood pressure?" "Did you take at least two measurements 5 minutes apart?"     150/97 took at [facility] 30 minutes ago ONSET: "When did you take your blood pressure?"     30 mins ago HISTORY: "Do you have a history of high blood pressure?"     Denies MEDICINES: "Are you taking any medicines for blood pressure?" "Have you missed any doses recently?"     Denies OTHER SYMPTOMS: "Do you have any symptoms?" (e.g., blurred vision, chest pain, difficulty breathing, headache, weakness)     Denies  Protocols used: Blood Pressure - High-A-AH

## 2024-04-12 ENCOUNTER — Ambulatory Visit (INDEPENDENT_AMBULATORY_CARE_PROVIDER_SITE_OTHER): Admitting: Family Medicine

## 2024-04-12 ENCOUNTER — Encounter: Payer: Self-pay | Admitting: Family Medicine

## 2024-04-12 VITALS — BP 119/67 | HR 70 | Temp 97.7°F | Resp 16 | Ht 67.0 in | Wt 169.5 lb

## 2024-04-12 DIAGNOSIS — E782 Mixed hyperlipidemia: Secondary | ICD-10-CM

## 2024-04-12 DIAGNOSIS — Z0001 Encounter for general adult medical examination with abnormal findings: Secondary | ICD-10-CM | POA: Diagnosis not present

## 2024-04-12 DIAGNOSIS — F1410 Cocaine abuse, uncomplicated: Secondary | ICD-10-CM | POA: Diagnosis not present

## 2024-04-12 DIAGNOSIS — Z87898 Personal history of other specified conditions: Secondary | ICD-10-CM

## 2024-04-12 DIAGNOSIS — H6122 Impacted cerumen, left ear: Secondary | ICD-10-CM

## 2024-04-12 DIAGNOSIS — Z0000 Encounter for general adult medical examination without abnormal findings: Secondary | ICD-10-CM

## 2024-04-12 MED ORDER — ATORVASTATIN CALCIUM 20 MG PO TABS: 20.0000 mg | ORAL_TABLET | Freq: Every day | ORAL | 3 refills | Status: AC

## 2024-04-12 NOTE — Progress Notes (Signed)
 Annual Wellness Visit     Patient: Alexander Walter, Male    DOB: [birth year], 63 y.o.   MRN: [id number] Visit Date: 04/12/2024  Today's Provider: LAURAINE LOISE BUOY, DO   Chief Complaint  Patient presents with   Medicare Wellness    Patient here for Welcome to Medicare visit.   Subjective    Alexander Walter is a 63 y.o. male who presents today for his Annual Wellness Visit.   HPI Alexander Walter is a 63 year old male who presents for follow-up and management of cholesterol medication side effects.  He had been experiencing severe indigestion at night, which he associates with the use of rosuvastatin . He stopped taking the medication about a month ago, and the indigestion symptoms have resolved since then. He still has a bottle of rosuvastatin  but is not currently taking it.  He occasionally uses crack cocaine, approximately once a month, and this is often accompanied by smoking. Substance use is not a daily occurrence. He is considering moving to [place] for a job opportunity, which he hopes will help reduce his substance use.  He walks about three miles a day, split between morning and evening walks, and has five dogs that keep him active. He is looking forward to a gastroenterology appointment due to swallowing difficulties, choking, and pain, but did not provide further details.  Plan he had a cold last week, which has since resolved.   Medications: Outpatient Medications Prior to Visit  Medication Sig Note   [DISCONTINUED] rosuvastatin  (CRESTOR ) 10 MG tablet Take 1 tablet (10 mg total) by mouth at bedtime. 04/12/2024: indigestion   No facility-administered medications prior to visit.    No Known Allergies  Patient Care Team: Rajean Desantiago, LAURAINE LOISE, DO as PCP - General (Family Medicine) Sankar, Seeplaputhur G, MD (General Surgery)  Review of Systems  Constitutional:  Negative for appetite change, chills, fatigue and fever.  HENT:  Negative for congestion, ear pain,  hearing loss, nosebleeds and trouble swallowing.   Eyes:  Negative for pain and visual disturbance.  Respiratory:  Negative for cough, chest tightness and shortness of breath.   Cardiovascular:  Negative for chest pain, palpitations and leg swelling.  Gastrointestinal:  Negative for abdominal pain, blood in stool, constipation, diarrhea, nausea and vomiting.  Endocrine: Negative for polydipsia, polyphagia and polyuria.  Genitourinary:  Negative for dysuria and flank pain.  Musculoskeletal:  Negative for arthralgias, back pain, joint swelling, myalgias and neck stiffness.  Skin:  Negative for color change, rash and wound.  Neurological:  Negative for dizziness, tremors, seizures, speech difficulty, weakness, light-headedness and headaches.  Psychiatric/Behavioral:  Negative for behavioral problems, confusion, decreased concentration, dysphoric mood and sleep disturbance. The patient is not nervous/anxious.   All other systems reviewed and are negative.        Objective    Vitals: BP 119/67 (BP Location: Left Arm, Patient Position: Sitting, Cuff Size: Normal)   Pulse 70   Temp 97.7 F (36.5 C) (Oral)   Resp 16   Ht 5' 7 (1.702 m)   Wt 169 lb 8 oz (76.9 kg)   SpO2 100%   BMI 26.55 kg/m      Physical Exam Vitals and nursing note reviewed.  Constitutional:      General: He is awake.     Appearance: Normal appearance.  HENT:     Head: Normocephalic and atraumatic.     Right Ear: Tympanic membrane, ear canal and external ear normal.  Left Ear: Tympanic membrane, ear canal and external ear normal.     Nose: Nose normal.     Mouth/Throat:     Mouth: Mucous membranes are moist.     Pharynx: Oropharynx is clear. No oropharyngeal exudate or posterior oropharyngeal erythema.   Eyes:     General: No scleral icterus.    Extraocular Movements: Extraocular movements intact.     Conjunctiva/sclera: Conjunctivae normal.     Pupils: Pupils are equal, round, and reactive to light.    Neck:     Thyroid : No thyromegaly or thyroid  tenderness.   Cardiovascular:     Rate and Rhythm: Normal rate and regular rhythm.     Pulses: Normal pulses.     Heart sounds: Normal heart sounds.  Pulmonary:     Effort: Pulmonary effort is normal. No tachypnea, bradypnea or respiratory distress.     Breath sounds: Normal breath sounds. No stridor. No wheezing, rhonchi or rales.  Abdominal:     General: Bowel sounds are normal. There is no distension.     Palpations: Abdomen is soft. There is no mass.     Tenderness: There is no abdominal tenderness. There is no guarding.     Hernia: No hernia is present.   Musculoskeletal:     Cervical back: Normal range of motion and neck supple.     Right lower leg: No edema.     Left lower leg: No edema.  Lymphadenopathy:     Cervical: No cervical adenopathy.   Skin:    General: Skin is warm and dry.   Neurological:     Mental Status: He is alert and oriented to person, place, and time. Mental status is at baseline.   Psychiatric:        Mood and Affect: Mood normal.        Behavior: Behavior normal.     Most recent functional status assessment:    04/12/2024    9:05 AM  In your present state of health, do you have any difficulty performing the following activities:  Hearing? 0  Vision? 0  Difficulty concentrating or making decisions? 0  Walking or climbing stairs? 0  Dressing or bathing? 0  Doing errands, shopping? 0  Preparing Food and eating ? N  Using the Toilet? N  In the past six months, have you accidently leaked urine? N  Do you have problems with loss of bowel control? N  Managing your Medications? N  Managing your Finances? N  Housekeeping or managing your Housekeeping? N   Most recent fall risk assessment:    04/12/2024    8:55 AM  Fall Risk   Falls in the past year? 0  Number falls in past yr: 0  Injury with Fall? 0  Risk for fall due to : No Fall Risks    Most recent depression screenings:    04/12/2024     8:55 AM 01/10/2024   10:06 AM  PHQ 2/9 Scores  PHQ - 2 Score 0 2  PHQ- 9 Score 4 6   Most recent cognitive screening:    04/12/2024    9:06 AM  6CIT Screen  What Year? 0 points  What month? 0 points  What time? 0 points  Count back from 20 0 points  Months in reverse 0 points  Repeat phrase 0 points  Total Score 0 points   Most recent Audit-C alcohol use screening    01/06/2024   10:37 AM  Alcohol Use Disorder Test (AUDIT)  1. How often do you have a drink containing alcohol? 3  2. How many drinks containing alcohol do you have on a typical day when you are drinking? 0  3. How often do you have six or more drinks on one occasion? 0  AUDIT-C Score 3   4. How often during the last year have you found that you were not able to stop drinking once you had started? 0  5. How often during the last year have you failed to do what was normally expected from you because of drinking? 0  6. How often during the last year have you needed a first drink in the morning to get yourself going after a heavy drinking session? 0  7. How often during the last year have you had a feeling of guilt of remorse after drinking? 2  8. How often during the last year have you been unable to remember what happened the night before because you had been drinking? 0  9. Have you or someone else been injured as a result of your drinking? 0  10. Has a relative or friend or a doctor or another health worker been concerned about your drinking or suggested you cut down? 0  Alcohol Use Disorder Identification Test Final Score (AUDIT) 5      Patient-reported   A score of 3 or more in women, and 4 or more in men indicates increased risk for alcohol abuse, EXCEPT if all of the points are from question 1   No results found for any visits on 04/12/24.  Assessment & Plan     Annual wellness visit done today including the all of the following: Reviewed patient's Family Medical History Reviewed and updated list of  patient's medical providers Assessment of cognitive impairment was done Assessed patient's functional ability Established a written schedule for health screening services Health Risk Assessent Completed and Reviewed Patient is not currently taking any opioid medications.   Exercise Activities and Dietary recommendations  Goals      Exercise 150 min/wk Moderate Activity        Immunization History  Administered Date(s) Administered   Moderna Sars-Covid-2 Vaccination 05/31/2020, 06/16/2020, 07/08/2020   PFIZER Comirnaty(Gray Top)Covid-19 Tri-Sucrose Vaccine 12/12/2020   Td 10/19/1997   Tdap 01/08/2012, 01/12/2024    Health Maintenance  Topic Date Due   Zoster Vaccines- Shingrix (1 of 2) 07/13/2024 (Originally 12/05/2008)   COVID-19 Vaccine (5 - 2024-25 season) 07/19/2024 (Originally 06/20/2023)   Pneumococcal Vaccine: 50+ Years (1 of 2 - PCV) 01/09/2025 (Originally 12/05/1977)   Colonoscopy  01/09/2025 (Originally 04/27/2023)   INFLUENZA VACCINE  05/19/2024   Medicare Annual Wellness (AWV)  04/12/2025   DTaP/Tdap/Td (4 - Td or Tdap) 01/11/2034   Hepatitis C Screening  Completed   HIV Screening  Completed   Hepatitis B Vaccines  Aged Out   HPV VACCINES  Aged Out   Meningococcal B Vaccine  Aged Out     Discussed health benefits of physical activity, and encouraged him to engage in regular exercise appropriate for his age and condition.    Welcome to Medicare preventive visit -     EKG 12-Lead  History of bradycardia -     EKG 12-Lead  Cocaine abuse, episodic use (HCC) -     EKG 12-Lead  Mixed hyperlipidemia -     Atorvastatin Calcium ; Take 1 tablet (20 mg total) by mouth daily.  Dispense: 90 tablet; Refill: 3  Impacted cerumen of left ear   Welcome to Medicare  visit; history of bradycardia Encouraged shingles vaccine, discussed benefits and prevention of postherpetic neuralgia. - Encourage shingles vaccination at the pharmacy. - EKG shows normal sinus rhythm without  ectopy with rate of 64, PR interval 146, QRS 78 and QT/QTc 410/422.  Left axis deviation present.  Cocaine abuse, episodic use Patient endorses use of crack cocaine approximately once per month, at which time he smokes cigarettes as well.  He notes he does not smoke otherwise.  He may be moving soon to try to abstain from substance use in the future, as he notes a different location can be helpful. - Encourage geographical change to improve substance use behavior. - Discuss potential support groups and accountability measures.  Mixed hyperlipidemia Discontinued rosuvastatin  due to indigestion. Atorvastatin 20 mg suggested as alternative. - Prescribe atorvastatin 20 mg. - Monitor for side effects such as myalgia. - Recheck lipid profile after establishing tolerance to atorvastatin.  Cerumen Impaction Left ear impaction causing decreased hearing. Debrox recommended to soften wax. - Recommend Debrox ear drops, 4-5 drops twice daily. - Encourage sleeping on the affected side to facilitate wax removal. - Offer ear irrigation if Debrox is ineffective.    Follow-up Advised follow-up in six months for atorvastatin response and health status. - Schedule follow-up appointment in six months. - Plan for one-year follow-up mAWV appointment.    Return in about 6 months (around 10/12/2024) for Chronic f/u.     I discussed the assessment and treatment plan with the patient  The patient was provided an opportunity to ask questions and all were answered. The patient agreed with the plan and demonstrated an understanding of the instructions.   The patient was advised to call back or seek an in-person evaluation if the symptoms worsen or if the condition fails to improve as anticipated.    LAURAINE LOISE BUOY, DO  [facility] [phone] (phone) [phone] (fax)  [facility]

## 2024-04-12 NOTE — Patient Instructions (Signed)
 Use debrox which is available at the pharmacy, to remove the wax from your ear.  Place the Debrox as directed and sleep on that side at night.

## 2024-05-18 ENCOUNTER — Telehealth: Payer: Self-pay

## 2024-05-18 NOTE — Telephone Encounter (Signed)
 Copied from CRM [id number]. Topic: Referral - Question >> May 18, 2024  8:58 AM Carlatta H wrote: Reason for CRM: Patient would like to see if there is anything that can be done because his GASTROENTEROLOGY appointment isnt until December//Please call to advise

## 2024-05-19 NOTE — Telephone Encounter (Signed)
 LMTCB if he had any questions, relayed the message on answering from what Dr. Donzella was advising.

## 2024-06-28 ENCOUNTER — Telehealth: Payer: Self-pay

## 2024-06-28 ENCOUNTER — Encounter: Payer: Self-pay | Admitting: Family Medicine

## 2024-06-28 ENCOUNTER — Ambulatory Visit
Admission: RE | Admit: 2024-06-28 | Discharge: 2024-06-28 | Disposition: A | Discharge status: Home / Self Care | Attending: Family Medicine | Admitting: Family Medicine

## 2024-06-28 ENCOUNTER — Ambulatory Visit: Admitting: Family Medicine

## 2024-06-28 ENCOUNTER — Ambulatory Visit
Admission: RE | Admit: 2024-06-28 | Discharge: 2024-06-28 | Disposition: A | Discharge status: Home / Self Care | Source: Ambulatory Visit | Attending: Family Medicine | Admitting: Family Medicine

## 2024-06-28 VITALS — BP 110/75 | HR 75 | Temp 97.9°F | Ht 67.0 in | Wt 167.3 lb

## 2024-06-28 DIAGNOSIS — M545 Low back pain, unspecified: Secondary | ICD-10-CM

## 2024-06-28 DIAGNOSIS — Z111 Encounter for screening for respiratory tuberculosis: Secondary | ICD-10-CM

## 2024-06-28 DIAGNOSIS — Z114 Encounter for screening for human immunodeficiency virus [HIV]: Secondary | ICD-10-CM

## 2024-06-28 MED ORDER — MELOXICAM 7.5 MG PO TABS: 7.5000 mg | ORAL_TABLET | Freq: Every day | ORAL | 0 refills | Status: DC

## 2024-06-28 MED ORDER — BACLOFEN 10 MG PO TABS: 10.0000 mg | ORAL_TABLET | Freq: Three times a day (TID) | ORAL | 0 refills | Status: DC

## 2024-06-28 NOTE — Patient Instructions (Signed)
 To keep you healthy, please keep in mind the following health maintenance items that you are due for:   There are no preventive care reminders to display for this patient.   Best Wishes,   Dr. Lang

## 2024-06-28 NOTE — Progress Notes (Signed)
 ACUTE VISIT   Patient: Alexander Walter   DOB: [birth year]   63 y.o. Male  MRN: [id number]   PCP: Donzella Lauraine SAILOR, DO  Chief Complaint  Patient presents with   Back Pain    Patient presents with back pain x 3-4 weeks. Worse with sitting, central- Left side.  Patient recalls an injury from 1984 where lower vertebrae was broken and old doctor told him he may have bone spurs develop.    Subjective    HPI HPI     Back Pain    Additional comments: Patient presents with back pain x 3-4 weeks. Worse with sitting, central- Left side.  Patient recalls an injury from 1984 where lower vertebrae was broken and old doctor told him he may have bone spurs develop.       Last edited by Cherry Chiquita HERO, CMA on 06/28/2024 10:32 AM.       Discussed the use of AI scribe software for clinical note transcription with the patient, who gave verbal consent to proceed.  History of Present Illness Alexander Walter is a 63 year old male who presents with acute onset of back pain.  He has been experiencing back pain for the past three to four weeks. The pain is located in the middle of his back on the left side and is described as a deep, stabbing pain. It is exacerbated by sitting and sometimes becomes unbearable. The pain does not radiate to his legs or abdomen.  He has a history of a lower vertebrae fracture in 1984, which was treated by an orthopedic specialist. He was informed that he might develop bone spurs and arthritis in the future. However, he does not believe this is related to his current pain as it is more lateral.  No recent injuries, urinary symptoms, or changes in bowel habits, although he did experience an episode of urgent bowel movements recently. He has not had any recent imaging of his back.  He has been taking ibuprofen  sporadically, but it has not provided significant relief. He has not been using any other medications consistently for this issue.  He denies any  bladder or fecal incontinence and has not experienced symptoms suggestive of sciatica.     Medications: Outpatient Medications Prior to Visit  Medication Sig   atorvastatin  (LIPITOR) 20 MG tablet Take 1 tablet (20 mg total) by mouth daily.   No facility-administered medications prior to visit.    Last metabolic panel Lab Results  Component Value Date   GLUCOSE 92 01/10/2024   NA 139 01/10/2024   K 4.3 01/10/2024   CL 102 01/10/2024   CO2 21 01/10/2024   BUN 14 01/10/2024   CREATININE 0.99 01/10/2024   EGFR 85 01/10/2024   CALCIUM  9.4 01/10/2024   PROT 7.0 01/10/2024   ALBUMIN 4.4 01/10/2024   LABGLOB 2.6 01/10/2024   AGRATIO 1.8 09/11/2015   BILITOT 0.4 01/10/2024   ALKPHOS 78 01/10/2024   AST 18 01/10/2024   ALT 17 01/10/2024   ANIONGAP 4 (L) 09/22/2016        Objective    BP 110/75 (BP Location: Left Arm, Patient Position: Sitting, Cuff Size: Normal)   Pulse 75   Temp 97.9 F (36.6 C) (Oral)   Ht 5' 7 (1.702 m)   Wt 167 lb 4.8 oz (75.9 kg)   SpO2 99%   BMI 26.20 kg/m    Physical Exam  Physical Exam  Physical Exam GENERAL: Alert, no acute  distress. MUSCULOSKELETAL: Normal gait. Full flexion, normal extension, and normal bilateral rotation of the spine. Pain with right abduction. Left lumbar lateral pain with right lateral flexion. Healing bruise, size of a quarter, left posterior flank. Muscle spasms and tenderness on left side. NEUROLOGICAL: Negative signs of sciatica.   No results found for any visits on 06/28/24.  Assessment & Plan     Assessment and Plan Assessment & Plan Left lumbar myofascial pain with muscle spasm Acute onset of left lumbar myofascial pain with muscle spasm, persisting for 3-4 weeks. Pain is stabbing and deep, exacerbated by sitting and relieved by standing or walking. No recent injury, but vertebral fracture in 1984. No radiculopathy or sciatica symptoms. Differential includes muscle strain, arthritis, or myofascial pain.  Physical exam reveals tenderness and muscle spasm in the left lumbar region, with a small bruise. No bladder or fecal incontinence. Initial treatment with ibuprofen  was ineffective. - Order lumbar x-ray to assess bony structures and alignment. - Prescribe baclofen  10 mg, up to three times a day, to relax muscles. - Prescribe meloxicam  7.5 mg once daily, may increase to 15 mg if needed, for anti-inflammatory effect. - Advise against taking additional NSAIDs such as ibuprofen , naproxen, Aleve, or Advil . - Instruct to apply ice for 10-15 minutes followed by heat for 10-15 minutes on the affected area, multiple times a day if possible. - Advise follow-up in 4 weeks to reassess symptoms and treatment efficacy.      Return in about 1 month (around 07/28/2024) for back pain .        Rockie Agent, MD  [facility] [phone] (phone) [phone] (fax)  [facility]

## 2024-06-28 NOTE — Telephone Encounter (Unsigned)
 Copied from CRM [id number]. Topic: Clinical - Request for Lab/Test Order >> Jun 28, 2024  1:42 PM Debby BROCKS wrote: Reason for CRM: Patient would like labs to be ordered so he can get tuberculosis and HIV blood work done as well as a note for health clearance

## 2024-06-28 NOTE — Telephone Encounter (Signed)
 Please see the message below.

## 2024-06-29 NOTE — Telephone Encounter (Unsigned)
 Copied from CRM #[id number]. Topic: Clinical - Request for Lab/Test Order >> Jun 29, 2024  1:52 PM Antwanette L wrote: Reason for CRM: Patient is requesting a TB and HIV test in order to enter a rehab facility. The office doesn't offer TB testing. Please contact the patient at [phone]

## 2024-06-29 NOTE — Telephone Encounter (Signed)
Please see the message below and advise.

## 2024-06-30 NOTE — Telephone Encounter (Signed)
 Patient advised.

## 2024-07-05 LAB — QUANTIFERON-TB GOLD PLUS
QuantiFERON Mitogen Value: >10 [IU]/mL
QuantiFERON Nil Value: 0.06 [IU]/mL
QuantiFERON TB1 Ag Value: 0.06 [IU]/mL
QuantiFERON TB2 Ag Value: 0.06 [IU]/mL

## 2024-07-05 LAB — HIV ANTIBODY (ROUTINE TESTING W REFLEX): HIV Screen 4th Generation wRfx: NONREACTIVE

## 2024-07-14 ENCOUNTER — Ambulatory Visit: Payer: Self-pay | Admitting: Family Medicine

## 2024-07-28 ENCOUNTER — Ambulatory Visit: Admitting: Family Medicine

## 2024-10-13 ENCOUNTER — Encounter: Payer: Self-pay | Admitting: Family Medicine

## 2024-10-13 ENCOUNTER — Ambulatory Visit: Admitting: Family Medicine

## 2024-10-13 VITALS — BP 107/82 | HR 75 | Temp 97.5°F | Ht 67.0 in | Wt 178.2 lb

## 2024-10-13 DIAGNOSIS — M5432 Sciatica, left side: Secondary | ICD-10-CM | POA: Diagnosis not present

## 2024-10-13 DIAGNOSIS — R944 Abnormal results of kidney function studies: Secondary | ICD-10-CM

## 2024-10-13 DIAGNOSIS — E782 Mixed hyperlipidemia: Secondary | ICD-10-CM | POA: Diagnosis not present

## 2024-10-13 DIAGNOSIS — Z0000 Encounter for general adult medical examination without abnormal findings: Secondary | ICD-10-CM | POA: Diagnosis not present
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
## 2024-10-13 NOTE — Patient Instructions (Signed)
 Recommended vaccines: Shingrix (shingles)

## 2024-10-17 LAB — LIPID PANEL
Chol/HDL Ratio: 3.3 ratio (ref 0.0–5.0)
Cholesterol, Total: 124 mg/dL (ref 100–199)
HDL: 38 mg/dL — ABNORMAL LOW
LDL Chol Calc (NIH): 63 mg/dL (ref 0–99)
Triglycerides: 129 mg/dL (ref 0–149)
VLDL Cholesterol Cal: 23 mg/dL (ref 5–40)

## 2024-10-17 LAB — COMPREHENSIVE METABOLIC PANEL WITH GFR
ALT: 36 IU/L (ref 0–44)
AST: 27 IU/L (ref 0–40)
Albumin: 4 g/dL (ref 3.9–4.9)
Alkaline Phosphatase: 79 IU/L (ref 47–123)
BUN/Creatinine Ratio: 11 (ref 10–24)
BUN: 12 mg/dL (ref 8–27)
Bilirubin Total: 0.5 mg/dL (ref 0.0–1.2)
CO2: 21 mmol/L (ref 20–29)
Calcium: 9.1 mg/dL (ref 8.6–10.2)
Chloride: 106 mmol/L (ref 96–106)
Creatinine, Ser: 1.05 mg/dL (ref 0.76–1.27)
Globulin, Total: 2.6 g/dL (ref 1.5–4.5)
Glucose: 76 mg/dL (ref 70–99)
Potassium: 4.1 mmol/L (ref 3.5–5.2)
Sodium: 141 mmol/L (ref 134–144)
Total Protein: 6.6 g/dL (ref 6.0–8.5)
eGFR: 79 mL/min/1.73

## 2024-10-17 LAB — MICROALBUMIN / CREATININE URINE RATIO
Creatinine, Urine: 71.4 mg/dL
Microalb/Creat Ratio: <4 mg/g{creat} (ref 0–29)
Microalbumin, Urine: <3 ug/mL

## 2024-10-28 ENCOUNTER — Ambulatory Visit: Payer: Self-pay | Admitting: Family Medicine

## 2024-10-30 ENCOUNTER — Ambulatory Visit: Payer: Self-pay

## 2024-10-30 DIAGNOSIS — K222 Esophageal obstruction: Secondary | ICD-10-CM | POA: Diagnosis not present

## 2024-10-30 DIAGNOSIS — D124 Benign neoplasm of descending colon: Secondary | ICD-10-CM | POA: Diagnosis not present

## 2024-10-30 DIAGNOSIS — D122 Benign neoplasm of ascending colon: Secondary | ICD-10-CM | POA: Diagnosis not present

## 2024-10-30 DIAGNOSIS — Z860101 Personal history of adenomatous and serrated colon polyps: Secondary | ICD-10-CM | POA: Diagnosis not present

## 2024-10-30 DIAGNOSIS — D123 Benign neoplasm of transverse colon: Secondary | ICD-10-CM | POA: Diagnosis not present

## 2024-10-30 DIAGNOSIS — K219 Gastro-esophageal reflux disease without esophagitis: Secondary | ICD-10-CM | POA: Diagnosis not present

## 2024-10-30 DIAGNOSIS — R1319 Other dysphagia: Secondary | ICD-10-CM | POA: Diagnosis not present

## 2024-10-30 DIAGNOSIS — K5730 Diverticulosis of large intestine without perforation or abscess without bleeding: Secondary | ICD-10-CM | POA: Diagnosis not present

## 2024-10-30 DIAGNOSIS — Z09 Encounter for follow-up examination after completed treatment for conditions other than malignant neoplasm: Secondary | ICD-10-CM | POA: Diagnosis present

## 2025-04-17 ENCOUNTER — Ambulatory Visit

## 2025-10-15 ENCOUNTER — Encounter: Admitting: Family Medicine
